# Patient Record
Sex: Female | Born: 1935
Health system: Southern US, Community
[De-identification: ages and names within clinical notes are randomized; demographics above are authoritative.]

## PROBLEM LIST (undated history)

## (undated) DIAGNOSIS — M8580 Other specified disorders of bone density and structure, unspecified site: Secondary | ICD-10-CM

## (undated) DIAGNOSIS — E039 Hypothyroidism, unspecified: Secondary | ICD-10-CM

## (undated) DIAGNOSIS — K5792 Diverticulitis of intestine, part unspecified, without perforation or abscess without bleeding: Secondary | ICD-10-CM

## (undated) DIAGNOSIS — K219 Gastro-esophageal reflux disease without esophagitis: Secondary | ICD-10-CM

## (undated) DIAGNOSIS — C3490 Malignant neoplasm of unspecified part of unspecified bronchus or lung: Secondary | ICD-10-CM

## (undated) DIAGNOSIS — G629 Polyneuropathy, unspecified: Secondary | ICD-10-CM

## (undated) DIAGNOSIS — C482 Malignant neoplasm of peritoneum, unspecified: Secondary | ICD-10-CM

## (undated) DIAGNOSIS — D72829 Elevated white blood cell count, unspecified: Secondary | ICD-10-CM

## (undated) HISTORY — DX: Polyneuropathy, unspecified: G62.9

## (undated) HISTORY — DX: Malignant neoplasm of peritoneum, unspecified: C48.2

## (undated) HISTORY — DX: Malignant neoplasm of unspecified part of unspecified bronchus or lung: C34.90

## (undated) HISTORY — DX: Elevated white blood cell count, unspecified: D72.829

## (undated) HISTORY — DX: Diverticulitis of intestine, part unspecified, without perforation or abscess without bleeding: K57.92

## (undated) HISTORY — DX: Gastro-esophageal reflux disease without esophagitis: K21.9

## (undated) HISTORY — DX: Hypothyroidism, unspecified: E03.9

## (undated) HISTORY — DX: Other specified disorders of bone density and structure, unspecified site: M85.80

## (undated) HISTORY — PX: TONSILLECTOMY: SHX5217

---

## 1997-11-24 ENCOUNTER — Ambulatory Visit (HOSPITAL_COMMUNITY): Admission: RE | Admit: 1997-11-24 | Discharge: 1997-11-24 | Payer: Self-pay | Admitting: Gastroenterology

## 1999-07-10 ENCOUNTER — Encounter (INDEPENDENT_AMBULATORY_CARE_PROVIDER_SITE_OTHER): Payer: Self-pay | Admitting: *Deleted

## 1999-07-10 ENCOUNTER — Ambulatory Visit (HOSPITAL_COMMUNITY): Admission: RE | Admit: 1999-07-10 | Discharge: 1999-07-10 | Payer: Self-pay | Admitting: Internal Medicine

## 2000-01-01 ENCOUNTER — Encounter: Payer: Self-pay | Admitting: Internal Medicine

## 2000-01-01 ENCOUNTER — Encounter: Admission: RE | Admit: 2000-01-01 | Discharge: 2000-01-01 | Payer: Self-pay | Admitting: Internal Medicine

## 2001-01-01 ENCOUNTER — Encounter: Admission: RE | Admit: 2001-01-01 | Discharge: 2001-01-01 | Payer: Self-pay | Admitting: Internal Medicine

## 2001-01-01 ENCOUNTER — Encounter: Payer: Self-pay | Admitting: Internal Medicine

## 2002-01-04 ENCOUNTER — Encounter: Payer: Self-pay | Admitting: Internal Medicine

## 2002-01-04 ENCOUNTER — Encounter: Admission: RE | Admit: 2002-01-04 | Discharge: 2002-01-04 | Payer: Self-pay | Admitting: Internal Medicine

## 2002-02-12 ENCOUNTER — Ambulatory Visit (HOSPITAL_COMMUNITY): Admission: RE | Admit: 2002-02-12 | Discharge: 2002-02-12 | Payer: Self-pay | Admitting: Gastroenterology

## 2002-02-12 ENCOUNTER — Encounter (INDEPENDENT_AMBULATORY_CARE_PROVIDER_SITE_OTHER): Payer: Self-pay | Admitting: *Deleted

## 2003-01-14 ENCOUNTER — Encounter: Payer: Self-pay | Admitting: Internal Medicine

## 2003-01-14 ENCOUNTER — Encounter: Admission: RE | Admit: 2003-01-14 | Discharge: 2003-01-14 | Payer: Self-pay | Admitting: Internal Medicine

## 2003-01-21 ENCOUNTER — Other Ambulatory Visit: Admission: RE | Admit: 2003-01-21 | Discharge: 2003-01-21 | Payer: Self-pay | Admitting: Internal Medicine

## 2004-01-16 ENCOUNTER — Encounter: Admission: RE | Admit: 2004-01-16 | Discharge: 2004-01-16 | Payer: Self-pay | Admitting: Internal Medicine

## 2004-08-05 DIAGNOSIS — C482 Malignant neoplasm of peritoneum, unspecified: Secondary | ICD-10-CM

## 2004-08-05 HISTORY — PX: SPLENECTOMY: SUR1306

## 2004-08-05 HISTORY — PX: ABDOMINAL HYSTERECTOMY: SUR658

## 2004-08-05 HISTORY — PX: OOPHORECTOMY: SHX86

## 2004-08-05 HISTORY — DX: Malignant neoplasm of peritoneum, unspecified: C48.2

## 2004-08-05 HISTORY — PX: TOTAL ABDOMINAL HYSTERECTOMY W/ BILATERAL SALPINGOOPHORECTOMY: SHX83

## 2005-01-15 ENCOUNTER — Encounter: Admission: RE | Admit: 2005-01-15 | Discharge: 2005-01-15 | Payer: Self-pay | Admitting: Internal Medicine

## 2005-01-15 IMAGING — CT CT PELVIS W/ CM
1 of 2 series · 14 of 32 positions shown, 18 images · IV contrast (GASTROGRAFIN & [ID] OMNI 300)
Comparison: none

CLINICAL DATA: Severe left lower quadrant pain.  
 CT ABDOMEN AND PELVIS WITH CONTRAST ? [DATE]:
 Scans were performed following intravenous injection of 100 cc of Omnipaque 300.  
 CT ABDOMEN WITH CONTRAST:
 The scan demonstrates that the patient has multiple masses in the spleen with the largest measuring 8 cm in diameter.  This mass is inhomogeneous with what I suspect is some central necrosis.  There is evidence of some subcapsular hemorrhage adjacent to this large lesion and there is what appears to be a daughter lesion just superomedial to the large lesion measuring 1 cm in size.  There is a 2.7 cm lesion in the superior medial aspect of the spleen, and there is a 2.3 cm cystic lesion in the splenic hilum. The differential diagnosis of these appearances should include metastatic disease, lymphoma, and, less likely, angiosarcoma.  The liver, pancreas, and adrenal glands appear normal.  The kidneys are normal other than the mass effect upon the left kidney by the large mass on the lower pole of the spleen.  Three small cystic lesions in the liver all appear to be benign cysts with no enhancement on delayed imaging.  One is in the inferior aspect of the right lobe measuring 15 mm in size, and the other two are more superior, one in the right lobe and one in the left, each measuring approximately 7 mm in size. 
 There is no periaortic adenopathy.  There are numerous diverticula in the colon, primarily in the descending portion but also in the cecum and ascending and transverse portions.

[Series 2: a&p w/ · axial · 0.64mm/px · z∈[-381,-11]mm · 14 of 81 slices shown, 18 images]
[im 4/81  soft-tissue]
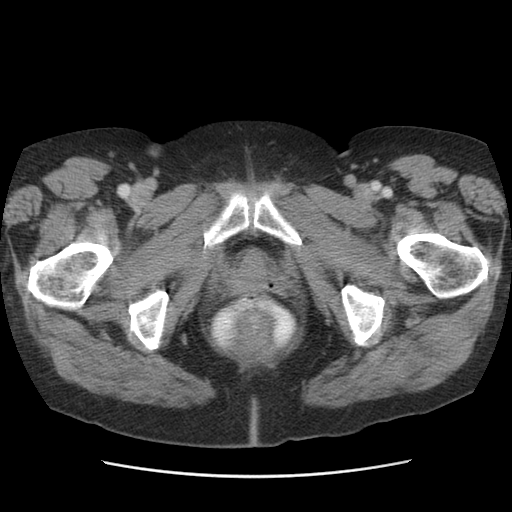
[im 4/81  bone]
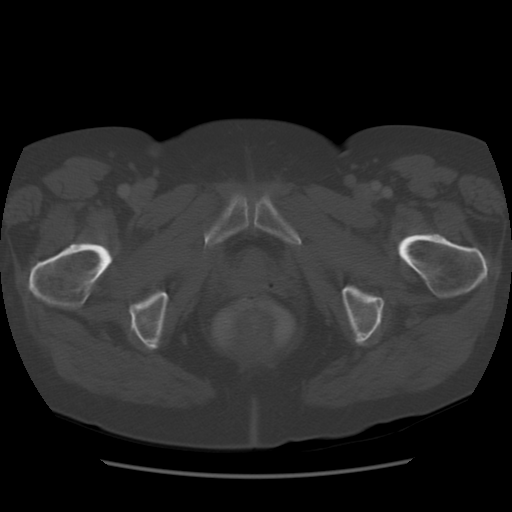
[im 11/81  soft-tissue]
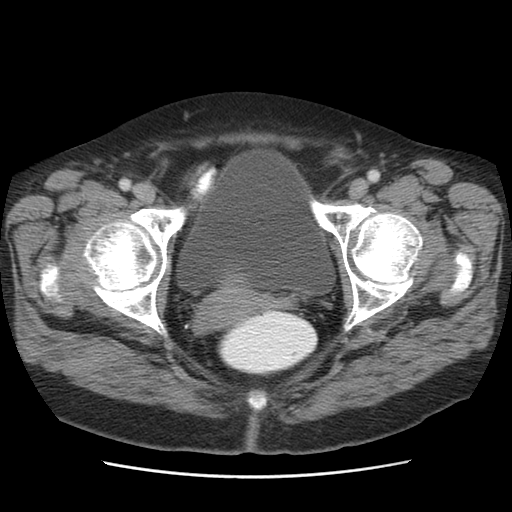
[im 19/81  soft-tissue]
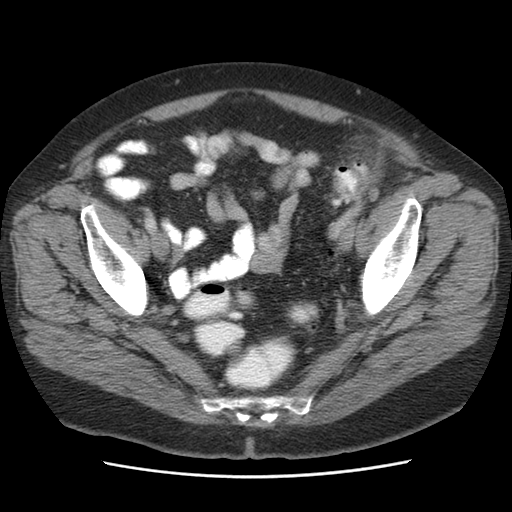
[im 26/81  soft-tissue]
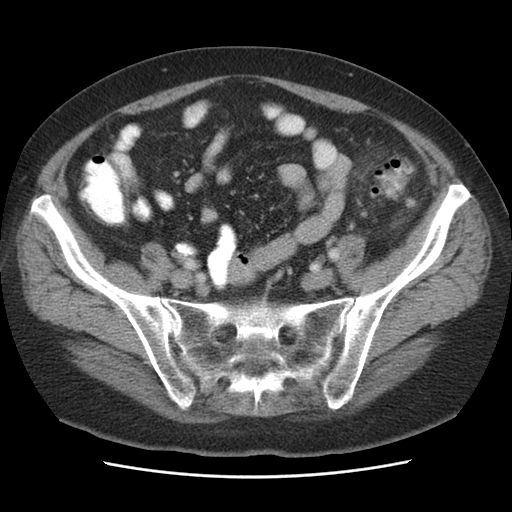
[im 30/81  soft-tissue]
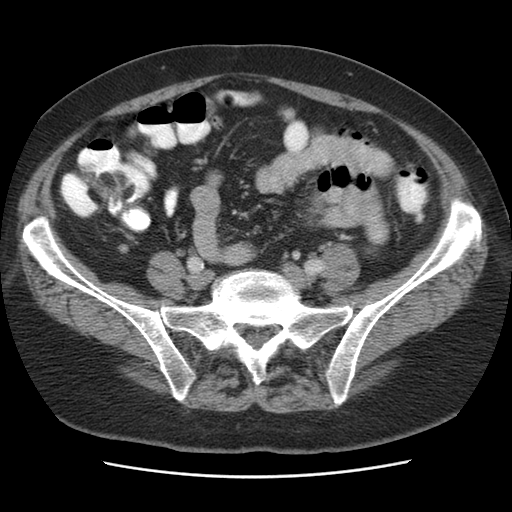
[im 37/81  soft-tissue]
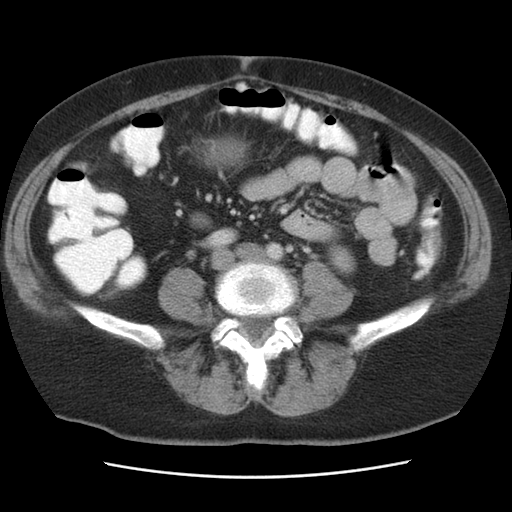
[im 44/81  soft-tissue]
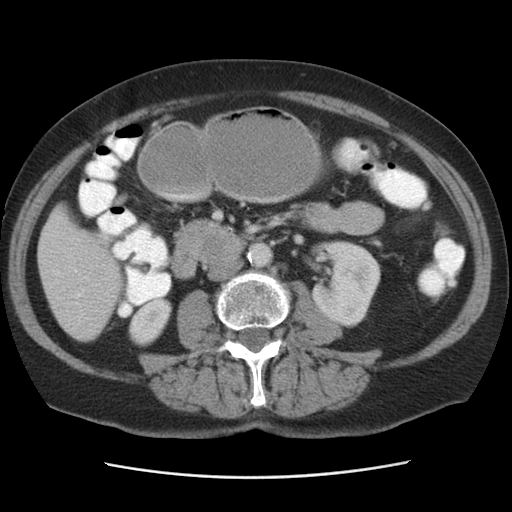
[im 51/81  soft-tissue]
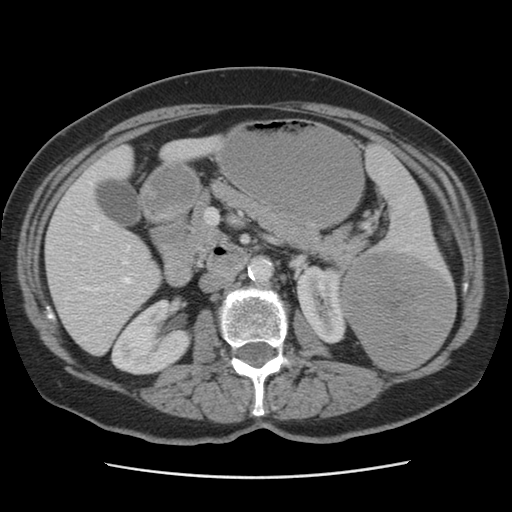
[im 55/81  soft-tissue]
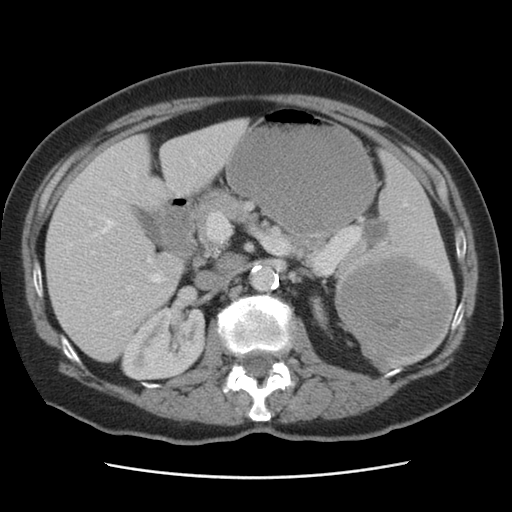
[im 55/81  bone]
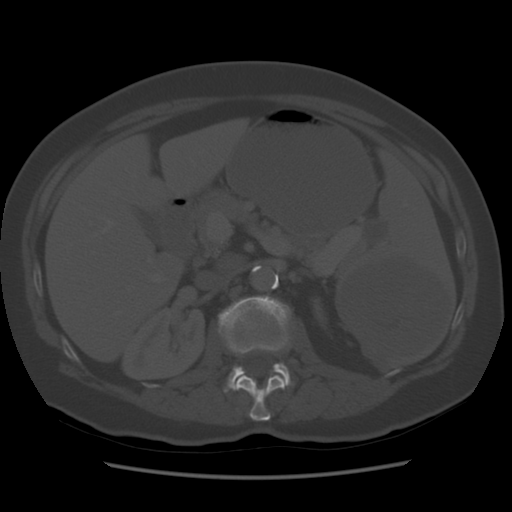
[im 62/81  soft-tissue]
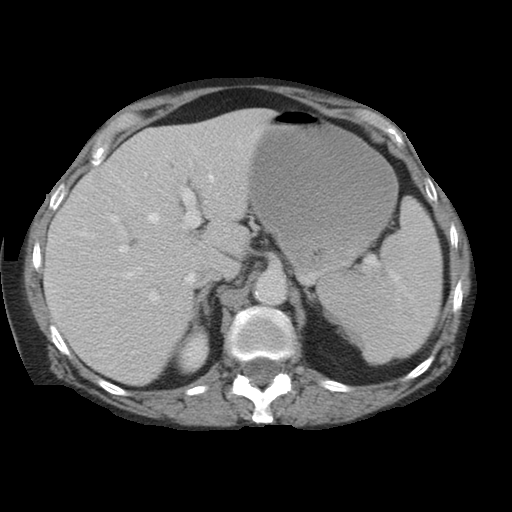
[im 66/81  lung]
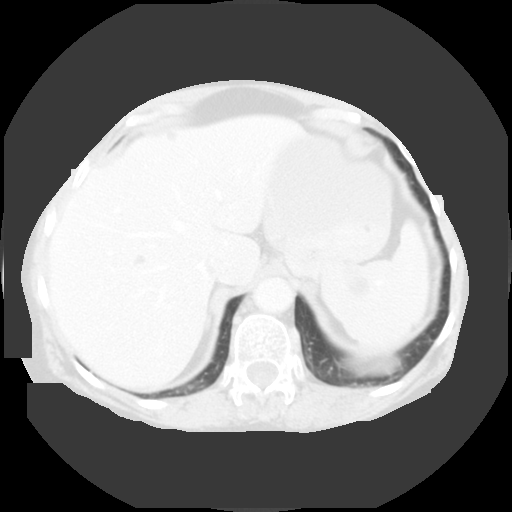
[im 70/81  soft-tissue]
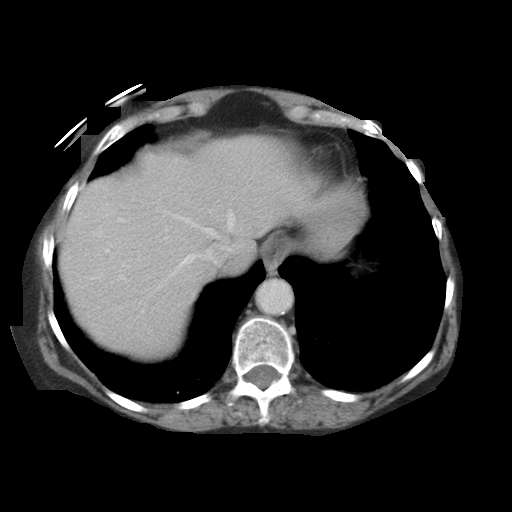
[im 70/81  lung]
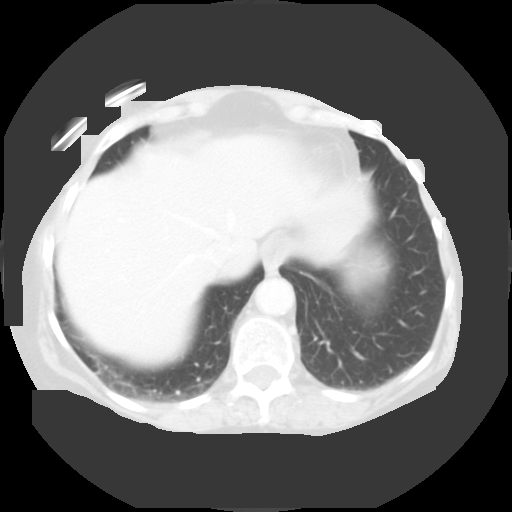
[im 73/81  lung]
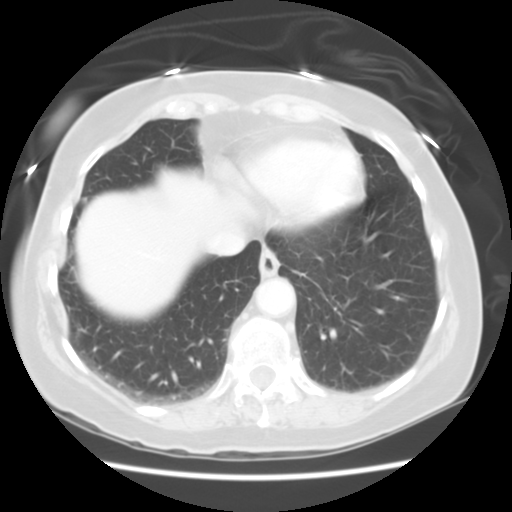
[im 77/81  soft-tissue]
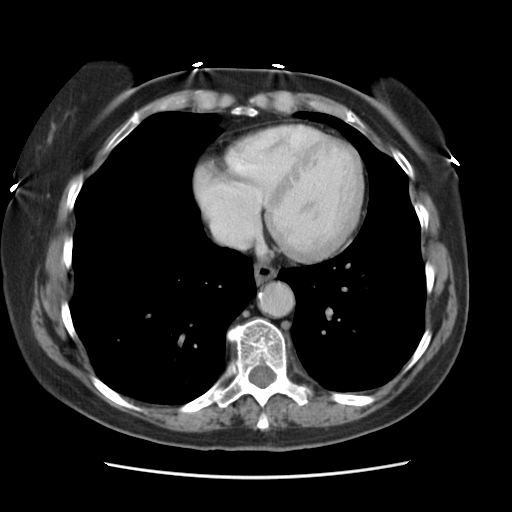
[im 77/81  lung]
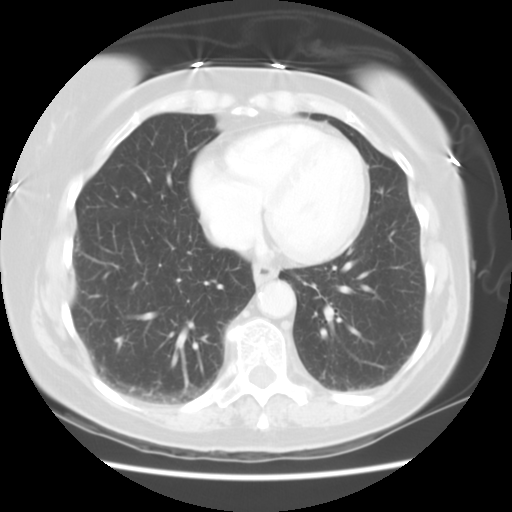

[14 of 32 positions shown; findings below may reference images not displayed]

IMPRESSION: 1.  Multiple splenic masses worrisome for malignancy of unknown etiology.
 2.  Benign liver cysts.
 3.  Diverticulosis. 
 CT PELVIS WITH CONTRAST:
 There is a focal area of acute diverticulitis over a 6 cm segment of the distal descending colon at the junction with the sigmoid in the left lower quadrant.  There is pericolonic soft tissues inflammation without evidence of abscess or free air.  The wall of the colon is focally thickened at that point although there is no definitive mass.  There are multiple diverticula in the distal descending colon. The uterus and ovaries are identified and appear normal.  The terminal ileum appears normal.  No free fluid or adenopathy of the pelvis.
IMPRESSION: Acute diverticulitis of the descending colon with pericolonic inflammation without an abscess. 
 This report was called to Dr. MANUEL QUINTAS by Dr. MANUEL QUINTAS.

## 2005-01-16 ENCOUNTER — Inpatient Hospital Stay (HOSPITAL_COMMUNITY): Admission: AD | Admit: 2005-01-16 | Discharge: 2005-01-17 | Payer: Self-pay | Admitting: Internal Medicine

## 2005-01-16 IMAGING — CR DG CHEST 2V
2 series · 2 of 2 positions shown · non-contrast
Comparison: None.

CLINICAL DATA: Diverticulitis, splenic mass. 
 CHEST ? 2 VIEW:

[w chest pa]
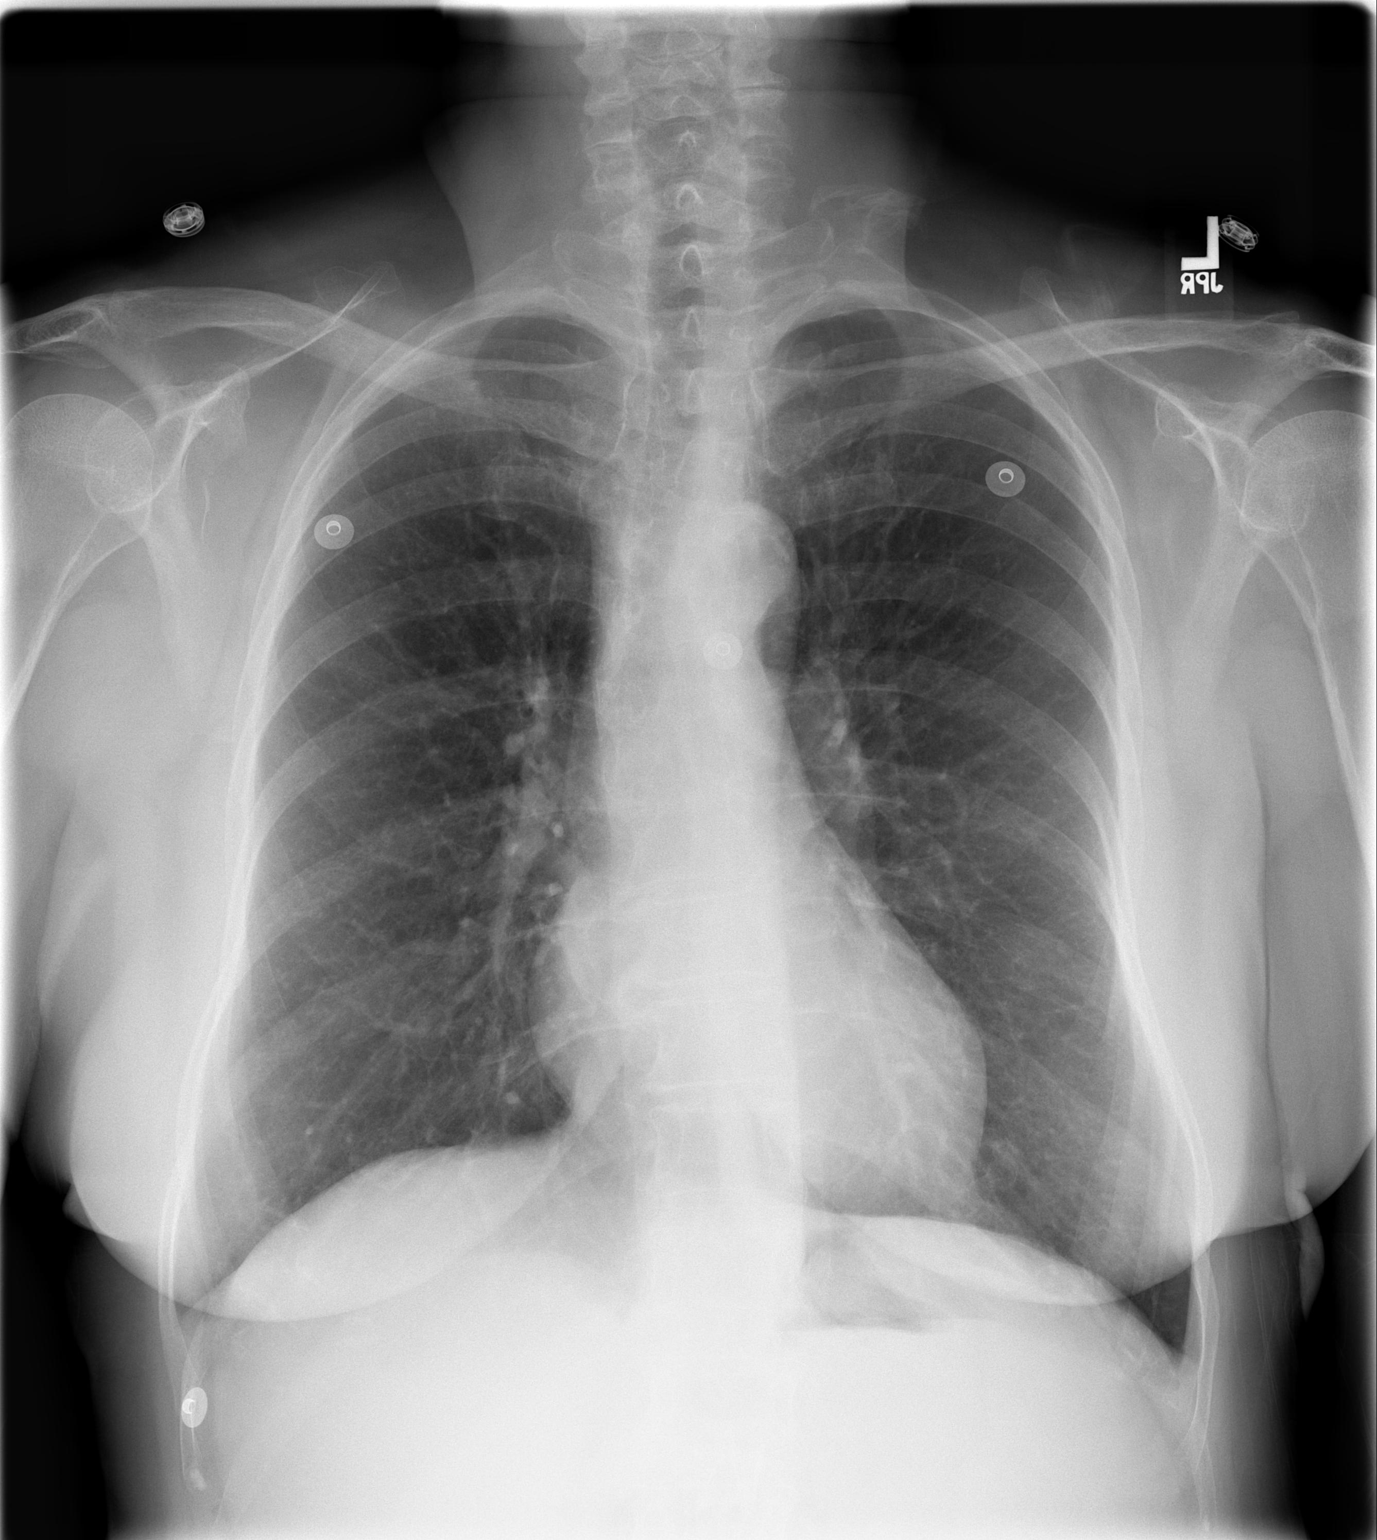

[w chest lat]
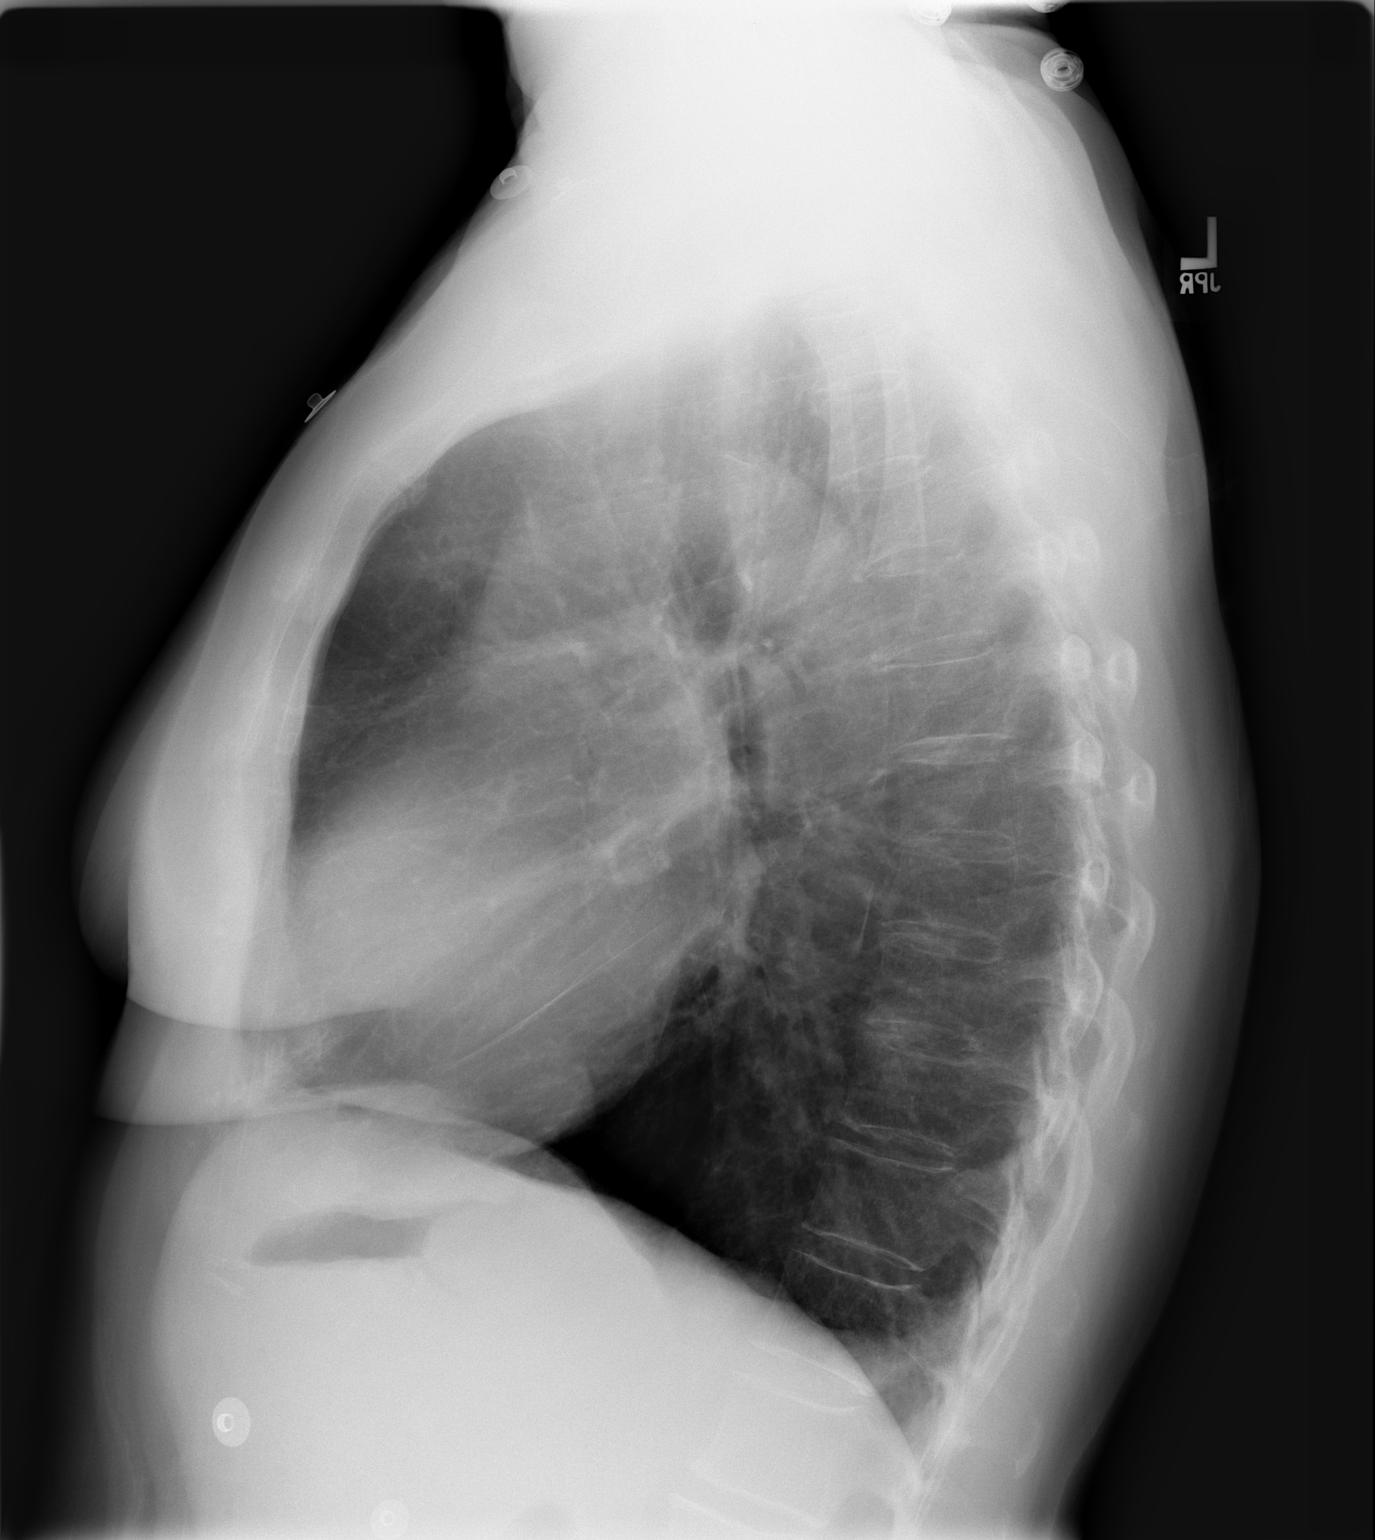

[2 of 2 positions shown; findings below may reference images not displayed]

The heart and mediastinal contours are within normal limits.  Mild COPD changes are present without focal airspace opacity or effusion.  Visualized skeleton is unremarkable.
IMPRESSION: Mild COPD.   No active disease.

## 2005-01-25 ENCOUNTER — Encounter (INDEPENDENT_AMBULATORY_CARE_PROVIDER_SITE_OTHER): Payer: Self-pay | Admitting: *Deleted

## 2005-01-25 ENCOUNTER — Ambulatory Visit (HOSPITAL_COMMUNITY): Admission: RE | Admit: 2005-01-25 | Discharge: 2005-01-25 | Payer: Self-pay | Admitting: *Deleted

## 2005-01-25 IMAGING — US US BIOPSY
1 series · 6 of 6 positions shown · non-contrast
Comparison: none

CLINICAL DATA: Patient was treated for diverticulitis 1-2 weeks ago and during the workup, incidental spleen lesions were identified. Spleen biopsy is requested.  This does carry increased risk of bleeding and consultation with the general surgeon. Dr. KPONYO, was obtained.  Together we decided to perform a fine needle aspirate biopsy first
 ULTRASOUND BIOPSY OF A SPLEEN LESION:
 In the right decubitus position, the left flank was prepped and draped in a sterile fashion. Lidocaine was utilized for local anesthesia.  Under ultrasound guidance a 19-gauge guide needle was inserted into the lower pole spleen lesion.  Three 22 gauge fine needle aspirates were obtained. A Gelfoam slurry was then injected through guide needle into the splenic lesion and filling the tract to the skin. No complications were encountered.
 Imaging demonstrates needle placement in the large lesion and the inferior aspect of the spleen.

[Series 1: unknown · 0.30mm/px · 6 of 6 slices shown]
[im 1/6]
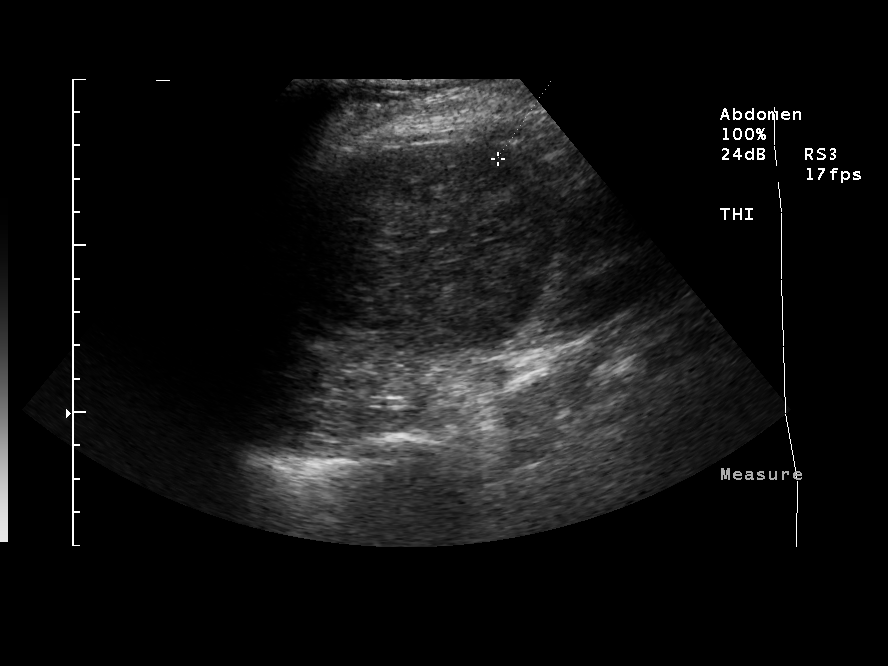
[im 2/6]
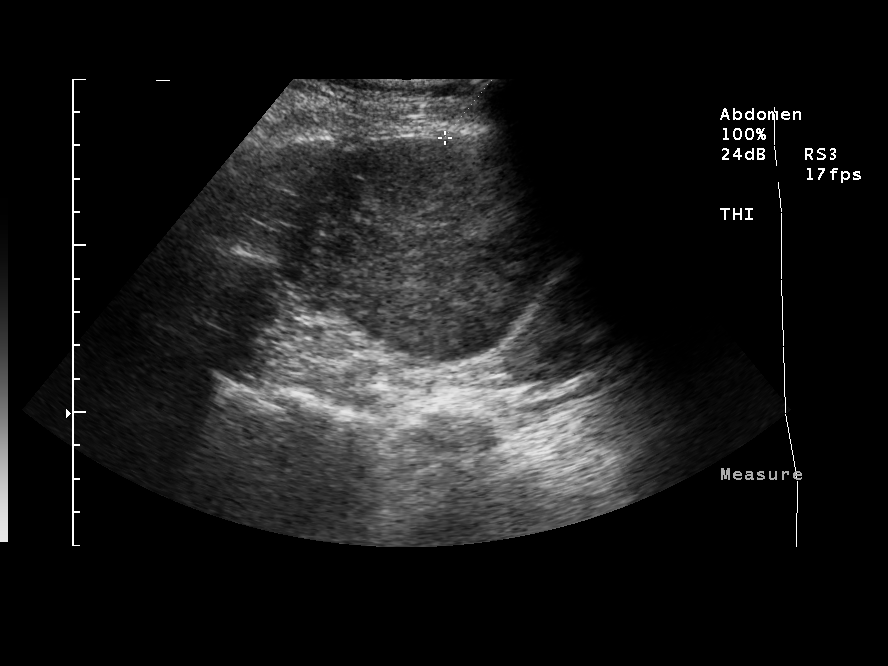
[im 3/6]
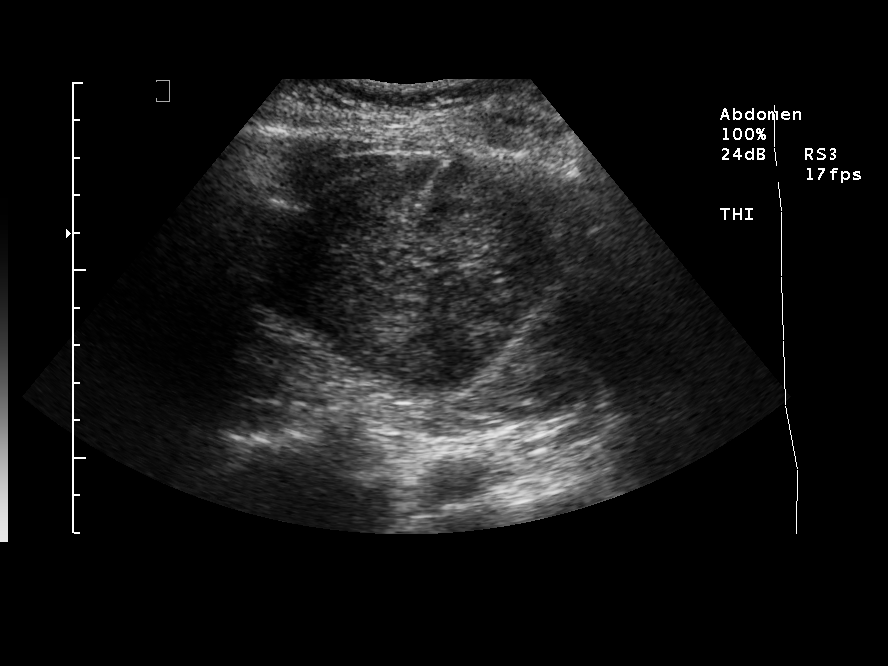
[im 4/6]
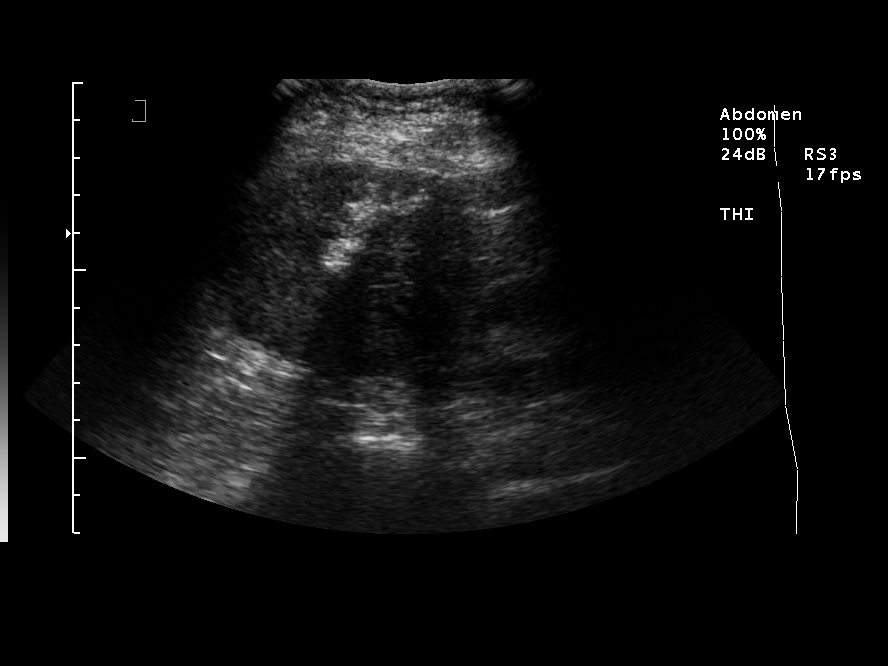
[im 5/6]
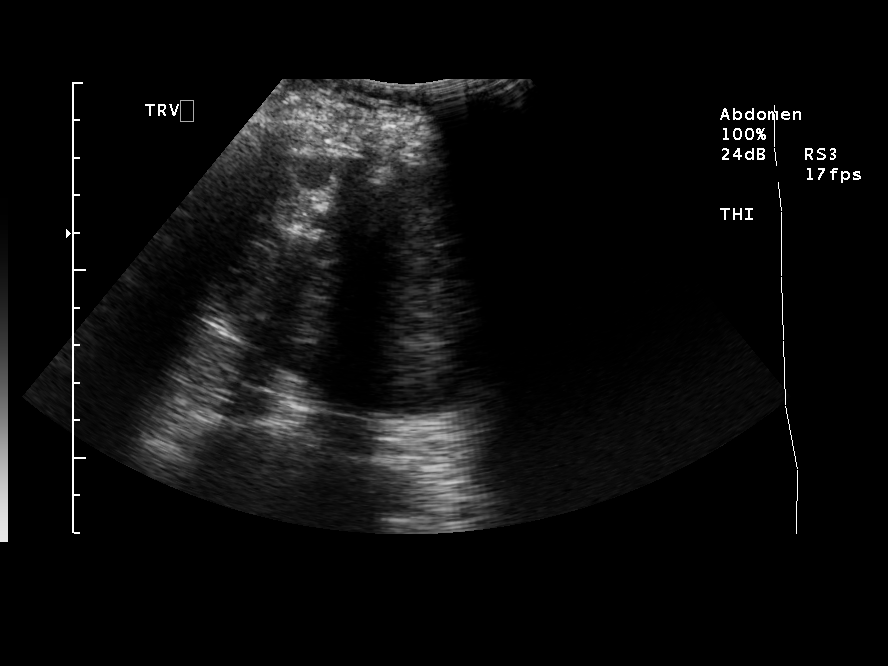
[im 6/6]
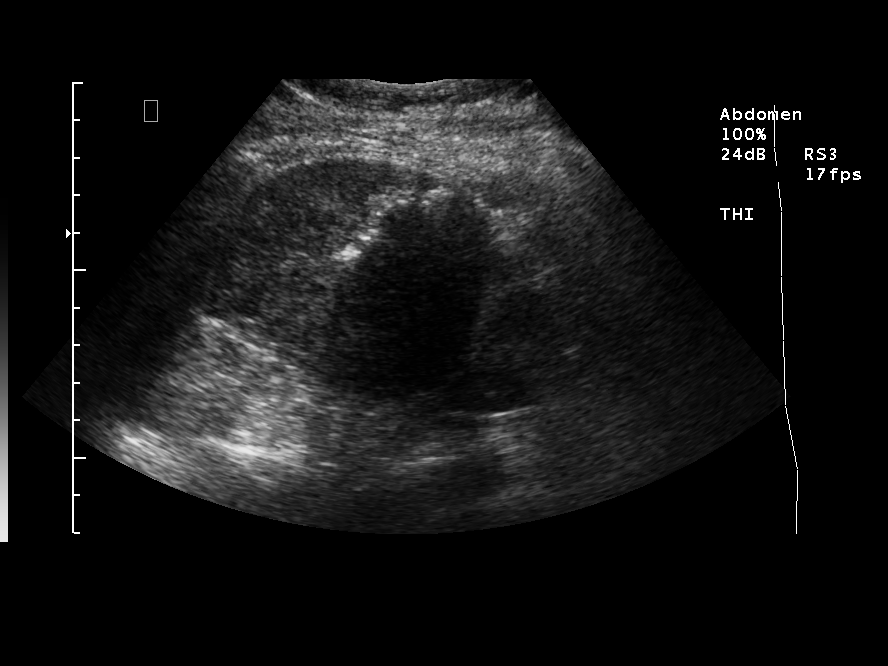

[6 of 6 positions shown; findings below may reference images not displayed]

IMPRESSION: Successful biopsy of a large splenic lesion. Pathology is pending.

## 2005-03-05 ENCOUNTER — Ambulatory Visit (HOSPITAL_COMMUNITY): Admission: RE | Admit: 2005-03-05 | Discharge: 2005-03-05 | Payer: Self-pay | Admitting: Gastroenterology

## 2005-03-06 ENCOUNTER — Encounter (INDEPENDENT_AMBULATORY_CARE_PROVIDER_SITE_OTHER): Payer: Self-pay | Admitting: *Deleted

## 2006-02-18 ENCOUNTER — Encounter: Admission: RE | Admit: 2006-02-18 | Discharge: 2006-02-18 | Payer: Self-pay | Admitting: Internal Medicine

## 2007-02-20 ENCOUNTER — Encounter: Admission: RE | Admit: 2007-02-20 | Discharge: 2007-02-20 | Payer: Self-pay | Admitting: Internal Medicine

## 2008-02-22 ENCOUNTER — Encounter: Admission: RE | Admit: 2008-02-22 | Discharge: 2008-02-22 | Payer: Self-pay | Admitting: Internal Medicine

## 2008-02-22 IMAGING — MG MM SCREEN MAMMOGRAM BILATERAL
4 series · 4 of 4 positions shown · non-contrast
Comparison: none

DG SCREEN MAMMOGRAM BILATERAL
Bilateral CC and MLO view(s) were taken.

DIGITAL SCREENING MAMMOGRAM WITH CAD:
The breast tissue is heterogeneously dense.  No masses or malignant type calcifications are 
identified.  Compared with prior studies.

[R CC]
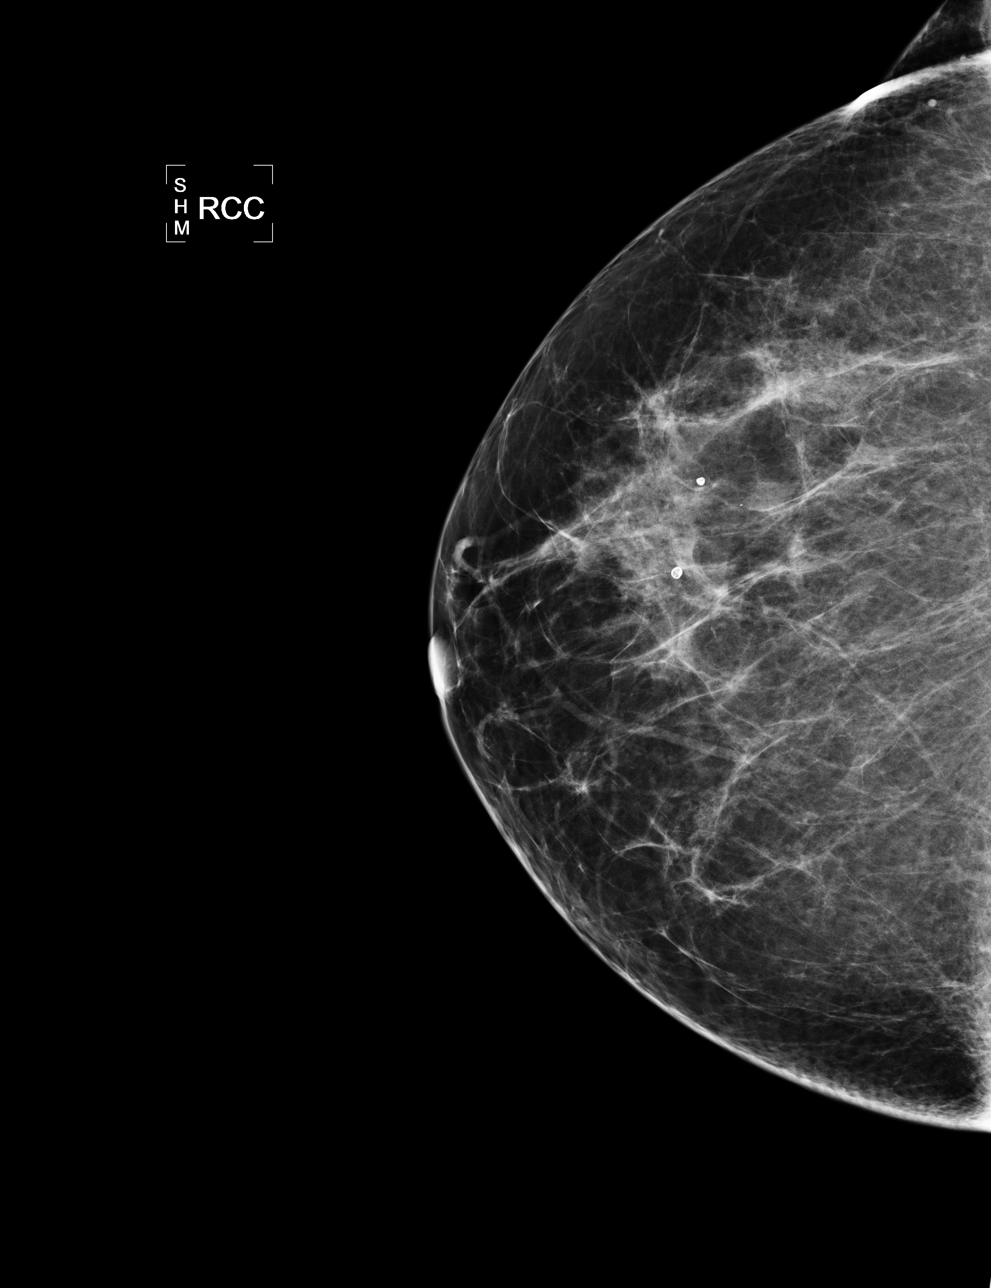

[L CC]
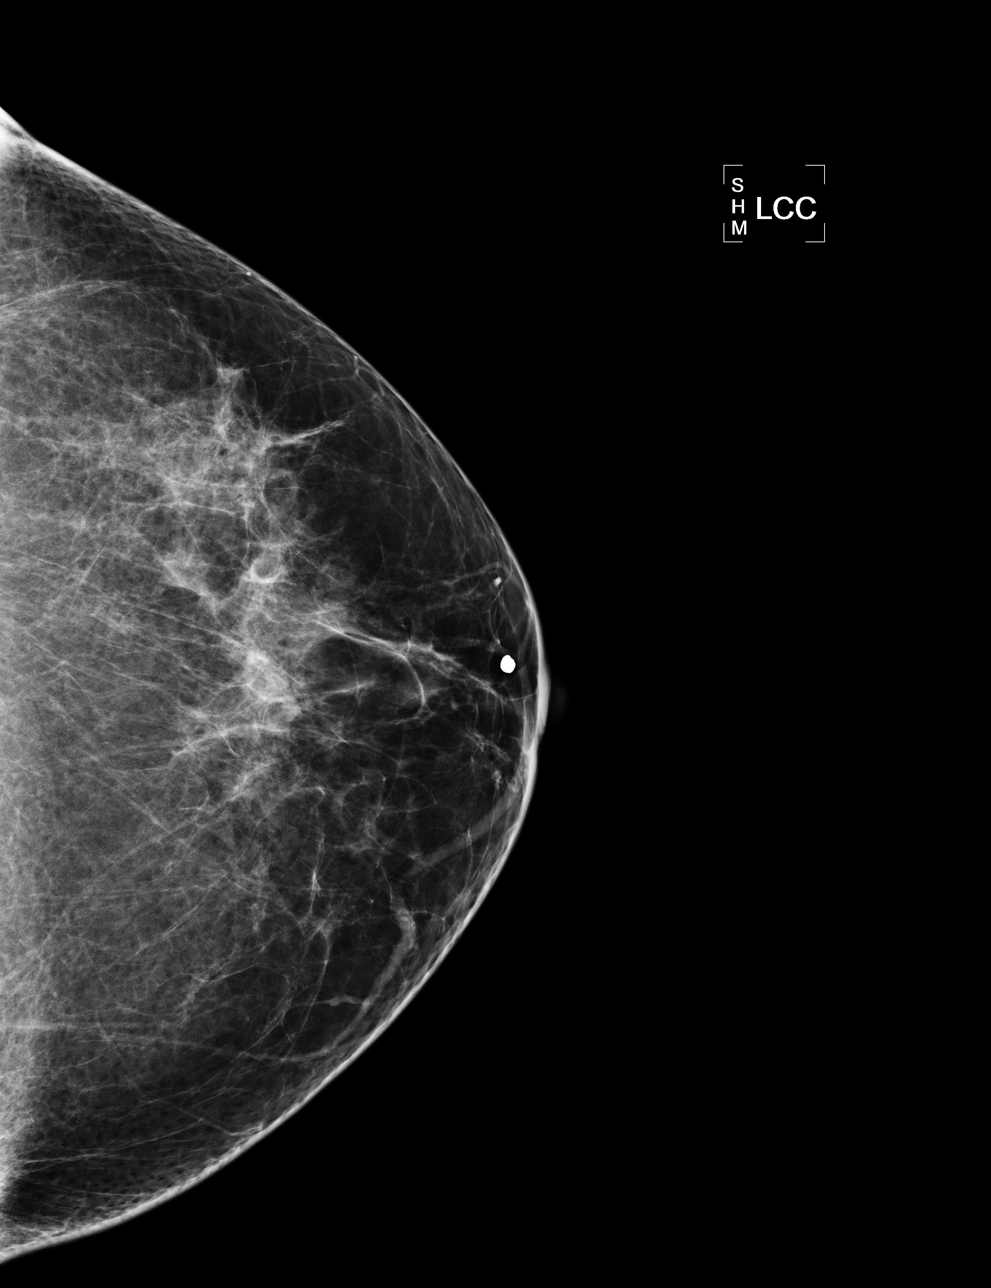

[L MLO]
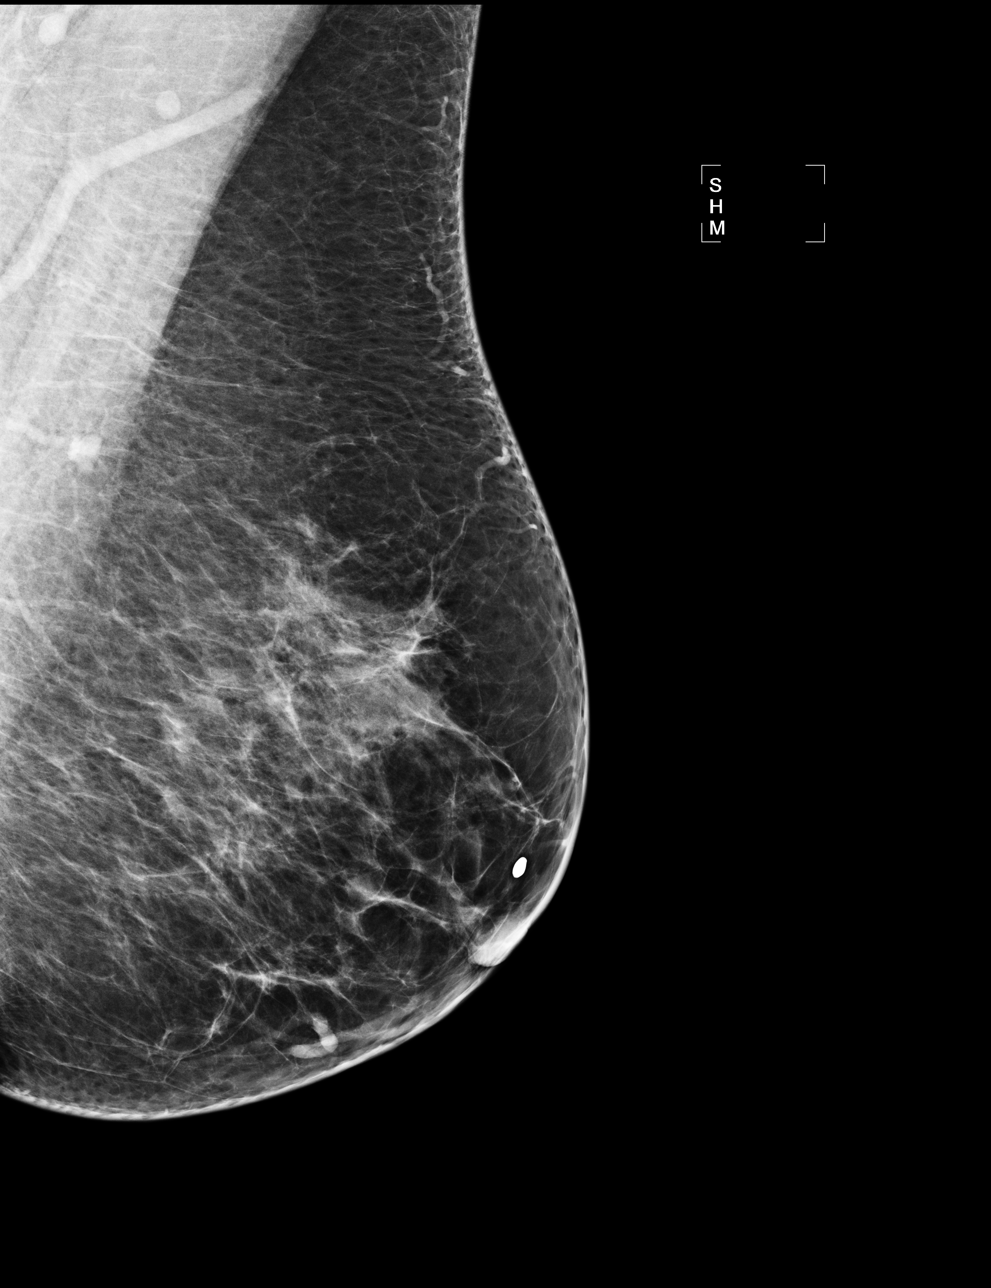

[R MLO]
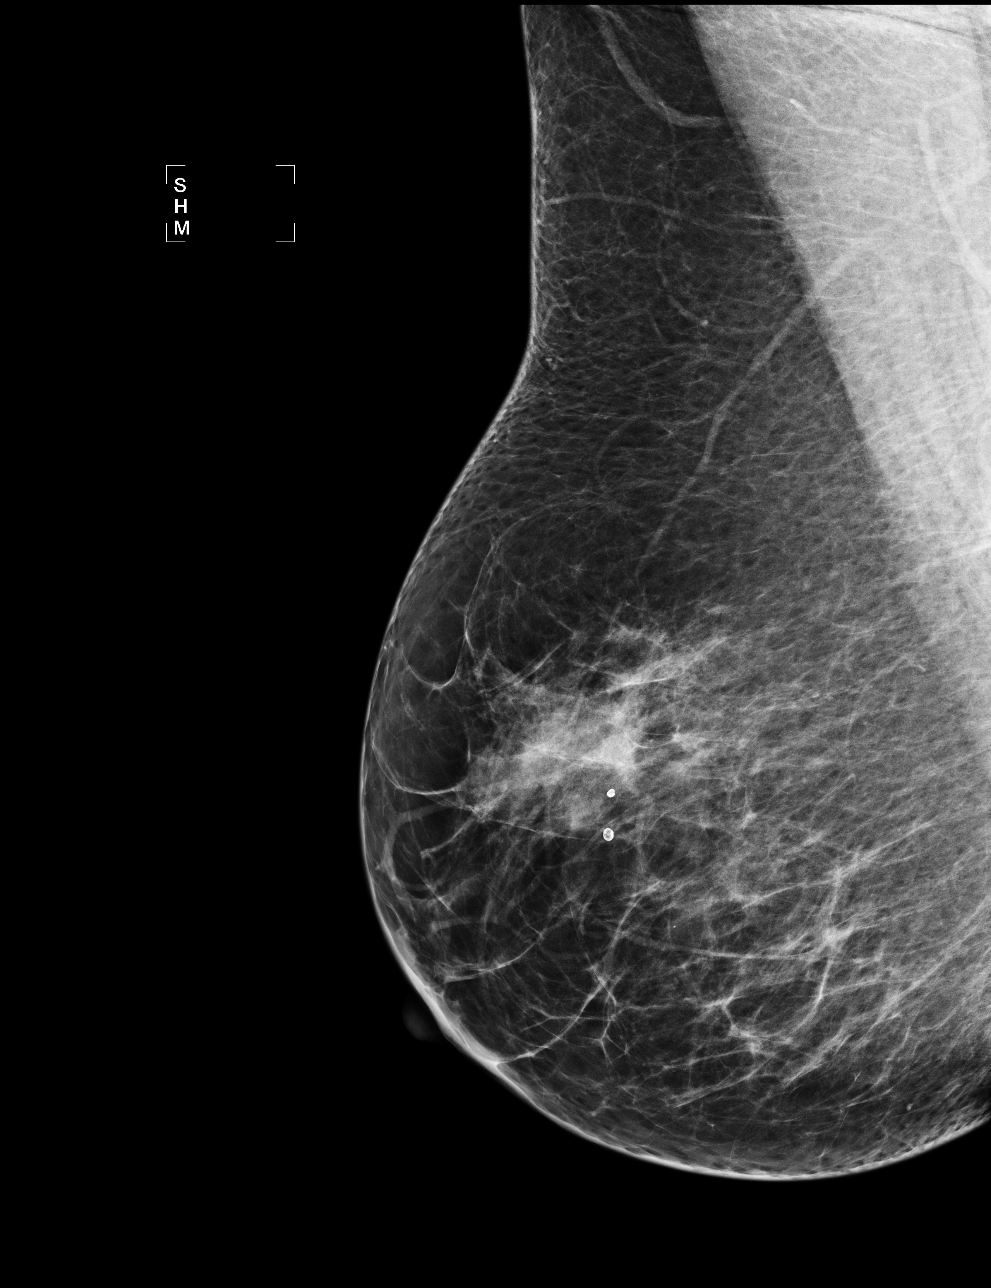

[4 of 4 positions shown; findings below may reference images not displayed]

IMPRESSION: No specific mammographic evidence of malignancy.  Next screening mammogram is recommended in one 
year.

ASSESSMENT: Negative - BI-RADS 1

Screening mammogram in 1 year.
ANALYZED BY COMPUTER AIDED DETECTION. , THIS PROCEDURE WAS A DIGITAL MAMMOGRAM.

## 2009-02-22 ENCOUNTER — Encounter: Admission: RE | Admit: 2009-02-22 | Discharge: 2009-02-22 | Payer: Self-pay | Admitting: Internal Medicine

## 2010-02-23 ENCOUNTER — Encounter: Admission: RE | Admit: 2010-02-23 | Discharge: 2010-02-23 | Payer: Self-pay | Admitting: Internal Medicine

## 2010-02-23 IMAGING — MG MM DIGITAL SCREENING
4 series · 4 of 4 positions shown · non-contrast
Comparison: Prior studies.

DG SCREEN MAMMOGRAM BILATERAL
Bilateral CC and MLO view(s) were taken.

DIGITAL SCREENING MAMMOGRAM WITH CAD:

[R CC]
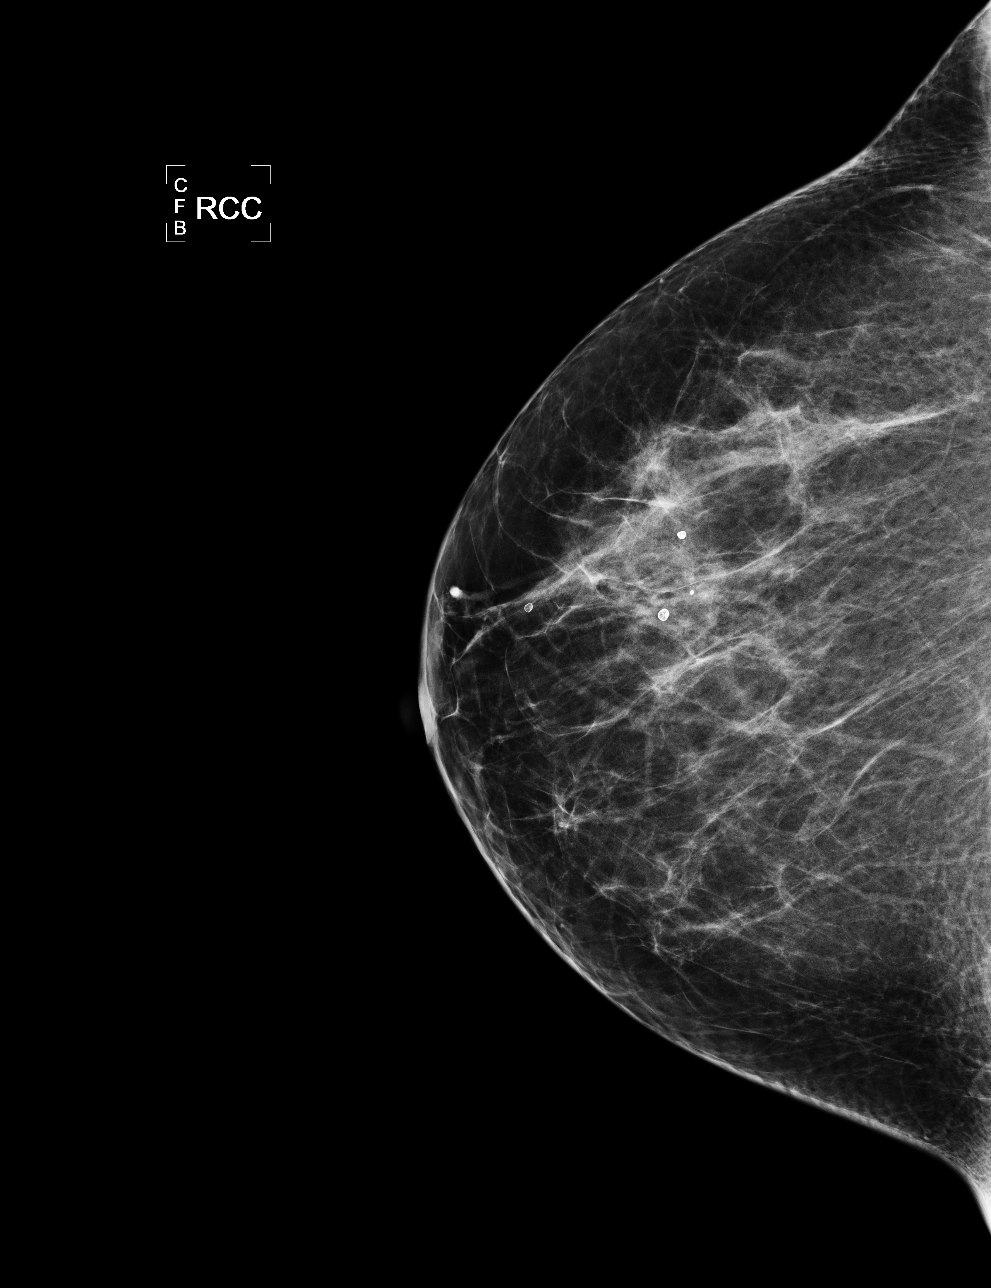

[L CC]
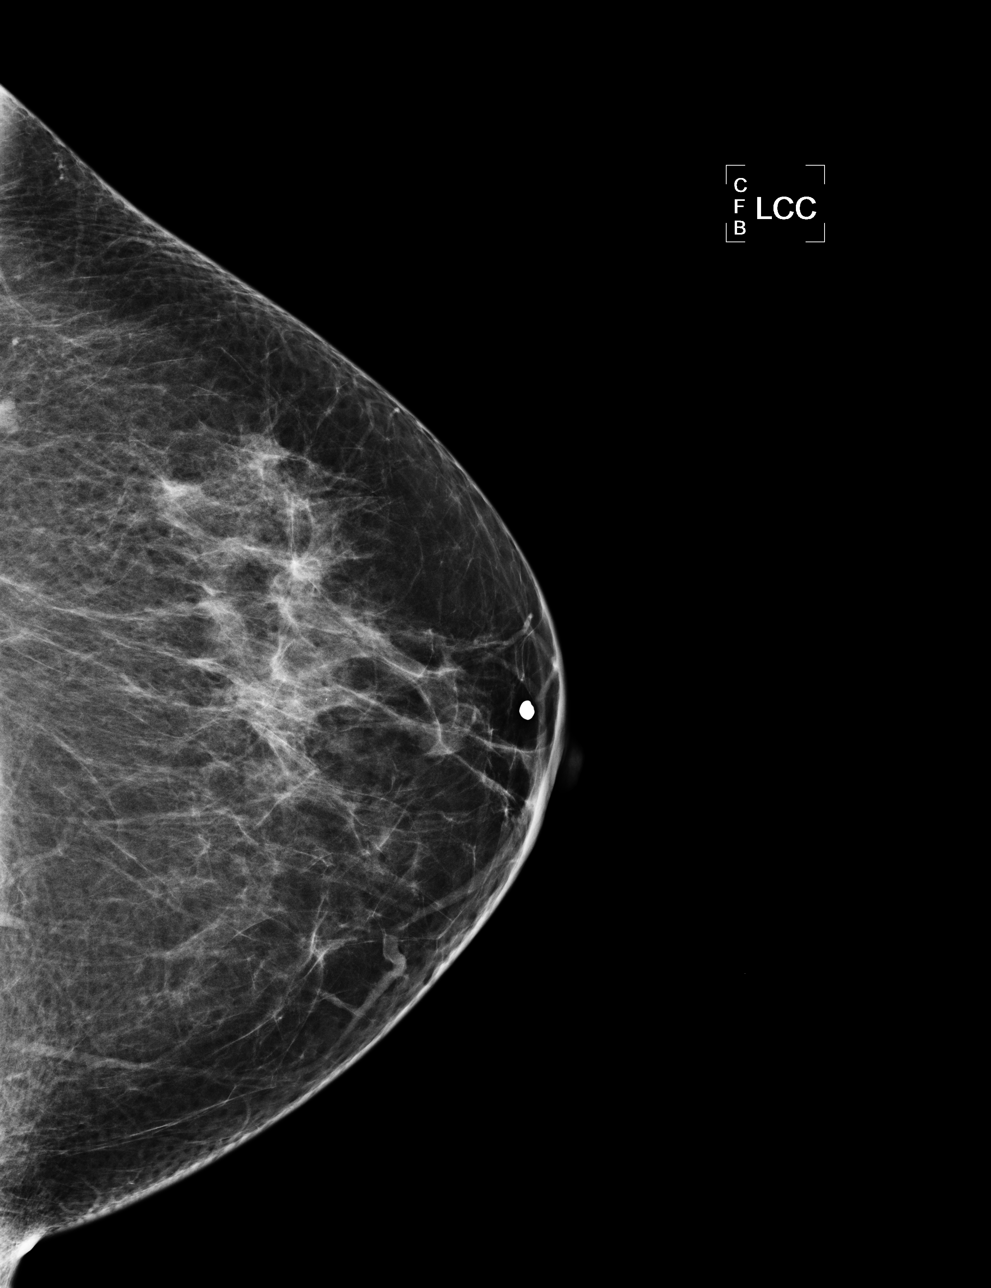

[L MLO]
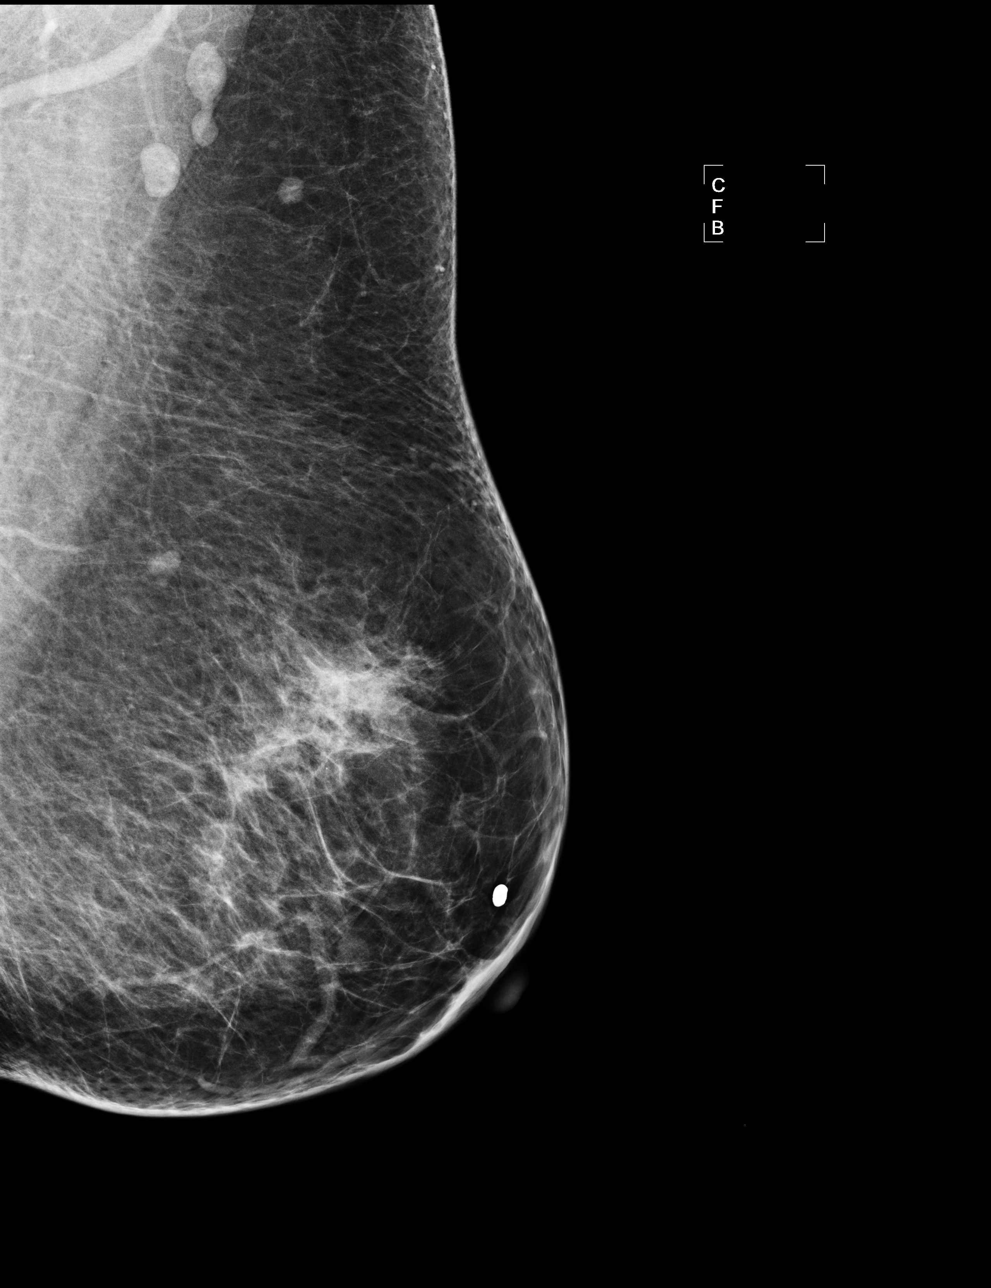

[R MLO]
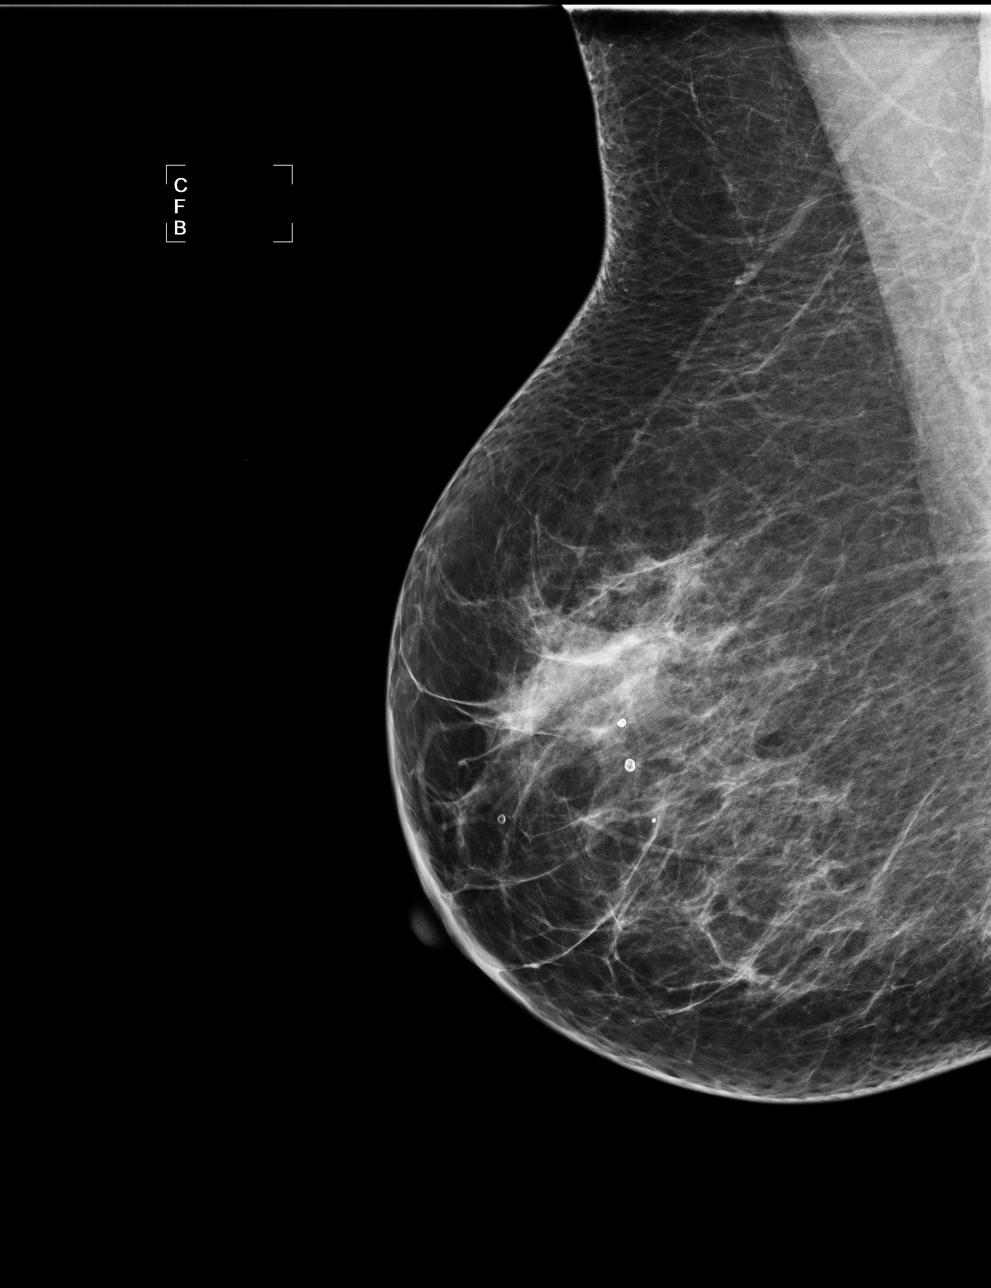

[4 of 4 positions shown; findings below may reference images not displayed]

There are scattered fibroglandular densities.  There is no dominant mass, architectural distortion 
or calcification to suggest malignancy.

Images were processed with CAD.
IMPRESSION: No mammographic evidence of malignancy.  Suggest yearly screening mammography.

A result letter of this screening mammogram will be mailed directly to the patient.

ASSESSMENT: Negative - BI-RADS 1

Screening mammogram in 1 year.
,

## 2010-08-05 HISTORY — PX: COLONOSCOPY: SHX174

## 2010-08-26 ENCOUNTER — Encounter: Payer: Self-pay | Admitting: Internal Medicine

## 2010-12-21 NOTE — Procedures (Signed)
[facility]  Patient:    Elaine [name], Elaine [name] Visit Number: [id number] MRN: [id number]          Service Type: END Location: ENDO Attending Physician:  Nelda Marseille Dictated by:   Petra Kuba, M.D. Proc. Date: 02/12/02 Admit Date:  02/12/2002 Discharge Date: 02/12/2002   CC:         Zollie Beckers D. Renne Crigler, M.D.   Procedure Report  PROCEDURE:  Colonoscopy with polypectomy.  INDICATIONS:  The patient with a history of colon polyps, due for repeat screening. Consent was signed after the risks, benefits, methods and options were thoroughly discussed in the past.  MEDICATIONS USED:  Demerol 50, Versed 5.  DESCRIPTION OF THE PROCEDURE:  A rectal was pertinent for external hemorrhoids. A digital examination was pertinent for being slightly tight anal and rectal sphincter which was slightly tender but no obvious mass lesions.  The pediatric video adjustable colonoscope was inserted and easily advanced around the colon to the cecum. This did not require any abdominal pressure or any position changes on insertion. Some left-sided and right-sided, left greater than right diverticuli were seen. The scope was inserted just into the opening of the terminal ileum. A quick look at the TI was normal.  The scope was then slowly withdrawn. The prep was adequate. There was some liquid stool that required washing and suctioning. There was some stool adherent to the wall of the right colon which could not be washed off. On slow withdrawal through the colon, left greater than right diverticuli were seen. A small polyp in the descending colon was seen and hot biopsied x 2. The scope was further withdrawn. A tiny distal sigmoid polyp was hot biopsied as well and put in the same container.  The scope was withdrawn back to the rectum and retroflexed. On retroflexed visualization a small polyp and some internal hemorrhoids were seen and hot biopsied x 1 on  retroflexion and x 1 on straight visualization. No additional findings were seen. This polyp was put in a second container.  The scope was reinserted a short way up the left side of the colon. Air was suctioned and the scope was removed. The patient tolerated the procedure well. There was no obvious immediate complication.  ENDOSCOPIC DIAGNOSES: 1. Internal and external hemorrhoids. 2. Distal rectal polyp hot biopsy. 3. Two small sigmoid and descending polyps, hot biopsied. 4. Left greater than right diverticuli. 5. Otherwise within normal limits to the cecum with a quick look at the terminal ileum.  PLAN:  Await pathology to determine future colonic screening. Have to see back p.r.n., otherwise return to the care of Dr. Renne Crigler for the customary health care maintenance to include yearly rectals and guaiacs. Dictated by:   Petra Kuba, M.D. Attending Physician:  Nelda Marseille DD:  02/12/02 TD:  02/15/02 Job: [id number]

## 2010-12-21 NOTE — H&P (Signed)
NAMEMARGRETTE, [name]             ACCOUNT NO.:  [id number]   MEDICAL RECORD NO.:  [id number]          PATIENT TYPE:  INP   LOCATION:  3735                         FACILITY:  [facility]   PHYSICIAN:  Elliot Cousin, M.D.    DATE OF BIRTH:  24-May-1932   DATE OF ADMISSION:  01/16/2005  DATE OF DISCHARGE:                                HISTORY & PHYSICAL   PRIMARY CARE PHYSICIAN:  Soyla Murphy. Pharr, M.D.   CHIEF COMPLAINT:  Left lower quadrant abdominal pain, left lower quadrant  lump, diarrhea.   HISTORY OF PRESENT ILLNESS:  The patient is a 77 year old lady with a past  medical history significant for diverticulosis, colon polyps,  hypothyroidism, who presents to the hospital as a direct admission from Dr.  Carolee Rota office.  The patient states that she began having abdominal pain,  nausea, and vomiting during the middle part of May.  The patient felt that  her symptoms were secondary to eating bad seafood in [place], [place].  Over the course of the following 2-3 weeks, the nausea and  vomiting resolved.  The left lower quadrant abdominal pain eased.  However,  approximately 4-5 days ago, the patient began feeling bad again.  She began  to have more left lower quadrant pain accompanied by 4-5 loose diarrheal  stools.  The patient also felt a lump in the lateral left lower quadrant.  The patient has not had any recent vomiting, although she has had some  nausea.  She denies bloody diarrhea and black tarry stools.  She denies  hematemesis and hemoptysis.  She denies any recent subjective fever and  chills.  The patient presented to Dr. Carolee Rota office, on January 14, 2005,  secondary to progressive left lower quadrant pain and generalized malaise.  Ms. Elaine [name], Dr. Carolee Rota P.A., evaluated the patient in the office.  Per  Ms. Barefoot, the patient had diverticulitis clinically.  Ms. Elaine [name] also  palpated a tender palpable mass in the left lower quadrant.  The patient was  treated with  Cipro and Flagyl and referred to [facility] for further  evaluation with a CT scan of the abdomen and pelvis.  Dr. Renne Crigler became aware  of the results of the CT scan today.  The CT scan of the abdomen and pelvis,  performed on January 15, 2005, revealed multiple splenic masses worrisome for  malignancy of unknown etiology and evidence of some subcapsular hemorrhage.  The CT scan also revealed acute diverticulitis of the descending colon with  pericolonic inflammation without any abscess.  The patient was told of the  findings.  Dr. Renne Crigler recommended hospital admission for further evaluation  and management.   PAST MEDICAL HISTORY:  1.  Diverticulosis with a history of diverticulitis in the past.  2.  Hypothyroidism.  3.  EGD per Dr. Ewing Schlein, in July 2003, revealed internal and external      hemorrhoids, sigmoid, and descending polyps, and right diverticula.  The      pathology report revealed an adenomatous polyp and a rectal polyp which      revealed hyperplastic squamous mucosa.  4.  Fibromyalgia  Rheumatica  5.  Status post tonsillectomy in the past.   MEDICATIONS:  1.  Levothyroxine 50 mcg daily.  2.  Cipro 500 mg b.i.d.  3.  Flagyl 500 mg q.i.d.  4.  Calcium 600 mg b.i.d.  5.  Multivitamin with iron once daily.   ALLERGIES:  1.  BELLADONNA.  2.  SULFA.  3.  ALEVE.   SOCIAL HISTORY:  The patient is divorced.  She has three children.  She is a  retired [employer] account planner.  She is fairly active.  She generally walks three  miles per day.  She denies alcohol, tobacco, and illicit drug use.   FAMILY HISTORY:  The patient's mother died of a heart attack at age 2.  Her  father died of colon cancer at age 24.  She had one sister who had Hilda Blades disease.  She had one brother who died of ITP.  She has two daughters  and one son.  One daughter is diabetic and the other daughter has Crohn's  disease.  Her son is alive and well.   REVIEW OF SYSTEMS:  The patient's review of  systems is positive for  occasional arthritic pain in her hands.  Otherwise her review of systems is  negative with the exception of what is mentioned above in the history of  present illness.   PHYSICAL EXAMINATION:  VITAL SIGNS:  Temperature is 98.6, pulse 80,  respiratory rate 18, blood pressure 112/79, oxygen saturation 99% on room  air.  GENERAL:  The patient is a pleasant, youthful-appearing, 77 year old  Caucasian lady who is currently sitting up in bed in no acute distress.  HEENT:  Head is normocephalic, nontraumatic.  Pupils are equal, round, and  reactive to light.  Extraocular movements are intact.  Conjunctivae are  clear.  Sclerae are white.  Tympanic membranes are clear bilaterally.  Nasal  mucosa is mildly dry.  No sinus tenderness.  Oropharynx reveals moist mucous  membranes.  No posterior exudates or erythema.  The patient has a full set  of dentures.  NECK:  Supple.  No adenopathy.  No thyromegaly.  No bruits.  No JVD.  LUNGS:  Clear to auscultation bilaterally.  HEART:  S1 S2 with no murmurs, rubs, or gallops.  ABDOMEN:  Positive bowel sounds.  Soft, nondistended.  There is a palpable  mass in the left upper quadrant.  No palpable mass in the left lower  quadrant.  She is mildly  tender over the left lower quadrant.  No rebound.  No guarding.  There is no flank ecchymosis.  RECTAL:  The patient has excellent rectal tone.  She does have a few  external hemorrhoids which were not bleeding.  There was a scant amount of  brown stool in the rectal vault which was guaiac negative.  No rectal mass  was palpated.  GU:  Deferred.  EXTREMITIES:  The patient's pedal pulses are 2+ bleeding.  No pretibial  edema.  No pedal edema.  The patient has excellent range of motion of her  extremities.  No acute joint abnormalities.  SKIN:  Good turgor.  NEUROLOGIC:  The patient is alert and oriented x 3.  Cranial nerves II-XII intact.  Strength is 5/5 throughout.  Sensation is intact.   Gait is within  normal limits.  LYMPH NODES:  No other palpable lymph nodes noted.   ADMISSION LABORATORY:  Chest x-ray:  Pending.  EKG:  Pending.  Sed rate 50.  PTT 34, PT 13.1, INR 1.1.  Lipase 27, amylase 70.  Sodium 141, potassium  3.9, chloride 107, CO2 27, glucose 124, BUN 11, creatinine 1.3.  Total  bilirubin 0.3, alkaline phosphatase 64, AST 17, ALT 13, total protein 6.6,  albumin 3.6, calcium 9.0.  WBC 8.7, hemoglobin 11.0, hematocrit 32.8, MCV  86.7, platelets 349.   CT scan of the abdomen and pelvis with contrast, January 15, 2005, multiple  splenic masses worrisome for malignancy of unknown etiology, the largest  measuring 8-cm in diameter.  The mass is inhomogeneous with suspected  central necrosis.  There is evidence of some subcapsular hemorrhage adjacent  to this large lesion.  There is another lesion associated with the 8-cm  lesion which measures 1-cm in size.  There is a 2.7-cm lesion in the  superior medial aspect of the spleen, and there is a 2.3-cm cystic lesion in  the splenic hilum.  The liver, pancreas, and adrenal glands appear normal.  The kidneys are normal other than the mass effect upon the left kidney by  the large mass on the lower pole of the spleen.  Three small cystic lesions  in the liver all appear to be benign cysts with no enhancement on delayed  imaging.  There is a focal area of acute diverticulitis over a 6-cm segment  of the distal descending colon at the junction with the sigmoid and the left  lower quadrant.  There is pericolonic soft tissue inflammation without  evidence of abscess or free air.  The wall of the colon is focally thickened  at that point, although there is no definitive mass.   ASSESSMENT:  1.  Multiple splenic masses with subcapsular hemorrhage associated with the      largest lesion.  Certainly this finding is worrisome for lymphoma.  2.  Acute diverticulitis of the descending colon with pericolonic      inflammation.  The  patient has been tolerating a soft diet at home with      no increase in abdominal pain.  She has not had any recent complaints of      diarrhea, nausea, or vomiting over the past 48 hours.  3.  Mild normocytic anemia.  4.  Hypothyroidism.   PLAN:  1.  The patient was directly admitted for further evaluation and management.  2.  We will consult general surgeon, Dr. Currie Paris.  I briefly      discussed the patient's case with Dr. Jamey Ripa.  He will see the patient      in the morning.  At this time, he recommended continued treatment of      diverticulitis.  There is uncertainty regarding whether or not the      patient will need a surgical excision of the spleen without a definitive      diagnosis.  3.  We will need to discuss the patient's findings with interventional      radiology to see if the splenic mass is amenable to biopsy even if it is      fine needle biopsy.  There is certainly a concern regarding further     hemorrhage, given this very vascular organ.  4.  We will continue antibiotic therapy with Cipro and Flagyl.  5.  We will start a low residue diet and continue to monitor the patient for      worsening abdominal pain.  6.  We will start a multivitamin with iron.  7.  We will consider a curbside consult with oncology if a biopsy of  the      spleen percutaneously is not recommended.  8.  We will check a chest x-ray, PA and lateral.  9.  Further recommendations per general surgery and/or interventional      radiology regarding the diagnostic workup.       DF/MEDQ  D:  01/16/2005  T:  01/16/2005  Job:  [id number]   cc:   Soyla Murphy. Renne Crigler, M.D.  [address]  [place]  [place] [address]  Fax: [phone]   Currie Paris, M.D.  [address]  [place]  [place] [address]

## 2010-12-21 NOTE — Op Note (Signed)
NAMEKEYLI, [name]             ACCOUNT NO.:  [id number]   MEDICAL RECORD NO.:  [id number]          PATIENT TYPE:  AMB   LOCATION:  ENDO                         FACILITY:  [facility]   PHYSICIAN:  Petra Kuba, M.D.    DATE OF BIRTH:  May 28, 1932   DATE OF PROCEDURE:  03/05/2005  DATE OF DISCHARGE:                                 OPERATIVE REPORT   PROCEDURE:  Colonoscopy.   INDICATIONS:  Patient with an abnormal CAT scan worrisome for metastatic  disease want to rule out colon source, also with a recent diverticulitis.  Consent was signed after risks, benefits, methods and options were  thoroughly discussed multiple times in the past.   MEDICINES USED:  Demerol 70, Versed 7.   DESCRIPTION OF PROCEDURE:  Rectal inspection was pertinent for small  external hemorrhoids with previous rectal stricture essentially unchanged.  The pediatric video adjustable colonoscope was inserted and easily advanced  around the colon to the cecum. This did require some abdominal pressure but  no position changes. On insertion, some left sided greater than rare right-  sided diverticula were seen but no mass lesions or significant inflammation  or infection. The cecum was identified by the appendiceal orifice and the  ileocecal valve. In fact, the scope was inserted a short ways into the  terminal ileum which was normal. Photo documentation was obtained. The scope  slowly withdrawn. Prep was adequate. There was some liquid stool that  required washing and suctioning and on slow withdrawal through the colon  other than the moderate significant left greater than rare right  diverticula, no abnormalities were seen other than a tiny distal sigmoid  polyp which was cold biopsied x2. No other mass lesions, polyps or  abnormalities were seen. Once back in the rectum, anorectal pull-through and  retroflexion confirmed the small hemorrhoids. The scope was straightened,  readvanced a short ways up the left side of  the colon, air was suctioned,  scope removed. The patient tolerated the procedure well. There was no  obvious or immediate complication.   ENDOSCOPIC DIAGNOSIS:  1.  Internal and external hemorrhoids with some scarring.  2.  Moderate left greater than rare right diverticula.  3.  Tiny distal sigmoid probable hyperplastic appearing polyp cold biopsied.  4.  Otherwise within normal limits to the terminal ileum.   PLAN:  Happy to see back p.r.n. Will need leave screening to Dr. Renne Crigler  pending surgical findings and healthcare condition in the future. Happy to  assist p.r.n. otherwise return care to the [facility] doctors and Dr. Renne Crigler  and be on standby to help as above.       MEM/MEDQ  D:  03/05/2005  T:  03/06/2005  Job:  [id number]   cc:   Soyla Murphy. Renne Crigler, M.D.  [address]  [place]  [place] [address]  Fax: [phone]   Dr. Lenis Noon  Surgery, [facility]ase   Dr. Noland Fordyce, Gynecology

## 2010-12-21 NOTE — Discharge Summary (Signed)
Elaine [name], Elaine [name]             ACCOUNT NO.:  [id number]   MEDICAL RECORD NO.:  [id number]          PATIENT TYPE:  INP   LOCATION:  3735                         FACILITY:  [facility]   PHYSICIAN:  Hettie Holstein, D.O.    DATE OF BIRTH:  06-May-1932   DATE OF ADMISSION:  01/16/2005  DATE OF DISCHARGE:  01/17/2005                                 DISCHARGE SUMMARY   PRIMARY CARE PHYSICIAN:  Soyla Murphy. Renne Crigler, M.D.   CONSULTATIONS:  Currie Paris, M.D., general surgery; Art A. Hoss,  M.D., interventional radiology.   ADMISSION DIAGNOSES:  1.  Diverticulitis.  2.  Splenic masses per imaging studies performed in the outpatient setting      via CT scan ordered by patient's primary care physician.   DISCHARGE DIAGNOSES:  1.  Diverticulitis with resolution of clinical symptoms and afebrile status      as well as no evidence of leukocytosis this admission, to continue      completion of therapy in the outpatient setting to conclude January 25, 2005.  2.  Incidental findings discovered on outpatient imaging study. CT scan      performed that revealed multiple splenic masses with concerns for      malignancy with 2.7-cm lesion in the superior medial aspect of the      spleen as well as a 2.3-cm cystic lesion in the splenic hilum. These      imaging studies were reviewed with Dr. Maryclare Bean as well as Dr. Jamey Ripa of      general surgery who saw Elaine [name] in consultation. I discussed these      findings with Dr. Myna Hidalgo of hematology/oncology in addition to Dr.      Jamey Ripa of general surgery as well as Dr. Maryclare Bean of interventional      radiology. It was concluded after a lengthy discussion that splenic      biopsy be pursued after resolution of her acute diverticulitis and      inflammation and to follow up with interventional radiology in one to      two weeks for biopsy to be performed and further decision based on the      findings on pathology at that time. If these are inconclusive  Dr. Jamey Ripa      has conveyed that he would likely pursue splenectomy at that point. If      the pathology  is revealing of lymphoma further considerations are to be      explored with oncology consultation/Dr. Myna Hidalgo.  3.  Hypothyroidism.  4.  History of fibromyalgia.  5.  Polymyalgia rheumatica.  6.  Status post tonsillectomy in the past.  7.  Family history of splenic mass and splenectomy in one of her siblings.   MEDICATIONS ON DISCHARGE:  1.  Flagyl 500 mg p.o. t.i.d. to conclude on January 25, 2005.  2.  Cipro 750  mg p.o. b.i.d., to continue through January 25, 2005.  3.  Continue her calcium and multivitamin as before in addition to      levothyroxine.   DISPOSITION:  She is encouraged to not engage in any strenuous activities or  heavy lifting until these issues have been resolved.  She is medically  stabled and has been requesting to go home as she is tolerating a diet and  requesting management and discharge home. She was instructed to call Dr.  Carolee Rota office to follow up within the next one to two weeks. In addition I  have spoken to Dr. Maryclare Bean who has informed me that his office is in the  process of coordinating outpatient follow-up with him splenic biopsy.   Dr. Jamey Ripa will be interested in pathology of his lesion and the  determination of whether or not to proceed to OR further.   HISTORY OF PRESENT ILLNESS:  For full details please refer to H&P as  dictated by Dr. Elliot Cousin. However, briefly, Elaine [name] is a pleasant  77 year old female with a medical history significant for diverticulosis,  colonic polyps, hypothyroidism, who presented to the hospital as a direct  admission from Dr. Carolee Rota office with complaints of abdominal pain and  clinical diagnosis of diverticulitis as well as radiographic evidence of  diverticulitis on outside imaging study/CT scan. In any event, she was  discovered to having concerning splenic findings on the scan and she was  admitted  to the hospital directly for further evaluation and management. Her  hospital course was uneventful. She underwent intravenous therapy with  Flagyl and Cipro. She experienced clinical resolution of her symptoms. She  remained afebrile and exhibited no leukocytosis. She tolerated diet,  conversations regarding where to proceed with regards to her splenic lesions  were undertaken.   As noted above it was concluded that Elaine [name] be best served by  allowing her acute diverticulitis to subside before proceeding to  intervention. She is to undergo biopsy in the outpatient setting and further  management is to ensue from that point.   She did, in anticipation of possible splenectomy, undergo immunization with  strep pneumonia vaccine in addition to a hemophilus influenza vaccine as  well as meningococcal vaccine.   She is seen in medically stable condition on the day of discharge.       ESS/MEDQ  D:  01/17/2005  T:  01/17/2005  Job:  [id number]   cc:   Soyla Murphy. Renne Crigler, M.D.  [address]  [place]  [place] [address]  Fax: [phone]   Currie Paris, M.D.  [address]  [place]  [place] [address]   Art A. Hoss, M.D.  [address]  [place], [place] [address]   Rose Phi. Myna Hidalgo, M.D.  [address] [facility]  [place], [place] [address]  Fax: [phone]

## 2011-01-18 ENCOUNTER — Other Ambulatory Visit: Payer: Self-pay | Admitting: Internal Medicine

## 2011-01-18 DIAGNOSIS — Z1231 Encounter for screening mammogram for malignant neoplasm of breast: Secondary | ICD-10-CM

## 2011-02-25 ENCOUNTER — Ambulatory Visit
Admission: RE | Admit: 2011-02-25 | Discharge: 2011-02-25 | Disposition: A | Discharge status: Home / Self Care | Payer: Medicare Other | Source: Ambulatory Visit | Attending: Internal Medicine | Admitting: Internal Medicine

## 2011-02-25 DIAGNOSIS — Z1231 Encounter for screening mammogram for malignant neoplasm of breast: Secondary | ICD-10-CM

## 2011-12-19 ENCOUNTER — Telehealth: Payer: Self-pay | Admitting: Oncology

## 2011-12-19 NOTE — Telephone Encounter (Signed)
S/w pt re appt for 5/22 w/HH - date per pt

## 2011-12-23 ENCOUNTER — Telehealth: Payer: Self-pay | Admitting: Oncology

## 2011-12-23 NOTE — Telephone Encounter (Signed)
Referred by Dr. Renne Crigler Dx-Leukocytosis

## 2011-12-25 ENCOUNTER — Ambulatory Visit: Payer: Medicare Other

## 2011-12-25 ENCOUNTER — Ambulatory Visit: Payer: Medicare Other | Admitting: Oncology

## 2011-12-25 ENCOUNTER — Other Ambulatory Visit: Payer: Medicare Other | Admitting: Lab

## 2012-01-23 ENCOUNTER — Other Ambulatory Visit: Payer: Self-pay | Admitting: Internal Medicine

## 2012-01-23 DIAGNOSIS — Z1231 Encounter for screening mammogram for malignant neoplasm of breast: Secondary | ICD-10-CM

## 2012-02-26 ENCOUNTER — Ambulatory Visit
Admission: RE | Admit: 2012-02-26 | Discharge: 2012-02-26 | Disposition: A | Discharge status: Home / Self Care | Payer: Medicare Other | Source: Ambulatory Visit | Attending: Internal Medicine | Admitting: Internal Medicine

## 2012-02-26 DIAGNOSIS — Z1231 Encounter for screening mammogram for malignant neoplasm of breast: Secondary | ICD-10-CM

## 2012-04-23 ENCOUNTER — Telehealth: Payer: Self-pay | Admitting: Oncology

## 2012-04-23 NOTE — Telephone Encounter (Signed)
S/W pt in re NP appt 9/30 @ 10:30 W/ Dr. Gaylyn Rong. Referring Dr. Renne Crigler Dx- Leukocytosis NP packet mail.

## 2012-04-24 ENCOUNTER — Encounter: Payer: Self-pay | Admitting: Oncology

## 2012-04-24 ENCOUNTER — Telehealth: Payer: Self-pay | Admitting: Oncology

## 2012-04-24 DIAGNOSIS — D72829 Elevated white blood cell count, unspecified: Secondary | ICD-10-CM | POA: Insufficient documentation

## 2012-04-24 NOTE — Telephone Encounter (Signed)
C/D on 9/20 for appt 9/30 °

## 2012-05-03 DIAGNOSIS — C482 Malignant neoplasm of peritoneum, unspecified: Secondary | ICD-10-CM | POA: Insufficient documentation

## 2012-05-03 NOTE — Patient Instructions (Addendum)
1.  Issue:  Elevated white blood cell (leukocytosis).   2.  Possibility:  Intermittent increased vs Reactive vs Primary bone marrow disease.  My view of your blood smear today did not show concern for acute leukemia/lymphoma.   Your WBC today is lower than when you had with your PCP.  This argues against acute leukemia. 3.  Work up:  Blood test sent today.  If anything abnormal, further work up may be considered.  4.  Recommendations: repeat blood count at the [facility] in about 3, 6, and 9 months.  Follow up in about 1 year.

## 2012-05-04 ENCOUNTER — Other Ambulatory Visit (HOSPITAL_BASED_OUTPATIENT_CLINIC_OR_DEPARTMENT_OTHER): Payer: Medicare Other | Admitting: Lab

## 2012-05-04 ENCOUNTER — Ambulatory Visit: Payer: Medicare Other

## 2012-05-04 ENCOUNTER — Ambulatory Visit (HOSPITAL_BASED_OUTPATIENT_CLINIC_OR_DEPARTMENT_OTHER): Payer: Medicare Other | Admitting: Oncology

## 2012-05-04 ENCOUNTER — Encounter: Payer: Self-pay | Admitting: Oncology

## 2012-05-04 ENCOUNTER — Telehealth: Payer: Self-pay | Admitting: Oncology

## 2012-05-04 VITALS — BP 147/73 | HR 71 | Temp 98.9°F | Resp 20 | Ht 62.5 in | Wt 144.4 lb

## 2012-05-04 DIAGNOSIS — D72829 Elevated white blood cell count, unspecified: Secondary | ICD-10-CM

## 2012-05-04 DIAGNOSIS — C482 Malignant neoplasm of peritoneum, unspecified: Secondary | ICD-10-CM

## 2012-05-04 DIAGNOSIS — D473 Essential (hemorrhagic) thrombocythemia: Secondary | ICD-10-CM

## 2012-05-04 LAB — CBC WITH DIFFERENTIAL/PLATELET
Basophils Absolute: 0.1 10*3/uL (ref 0.0–0.1)
EOS%: 1.2 % (ref 0.0–7.0)
LYMPH%: 40.5 % (ref 14.0–49.7)
MCH: 31.1 pg (ref 25.1–34.0)
MCHC: 32.7 g/dL (ref 31.5–36.0)
MONO#: 0.7 10*3/uL (ref 0.1–0.9)
NEUT#: 5.7 10*3/uL (ref 1.5–6.5)
NEUT%: 51.1 % (ref 38.4–76.8)

## 2012-05-04 LAB — MORPHOLOGY

## 2012-05-04 LAB — CA 125: CA 125: 16.8 U/mL (ref 0.0–30.2)

## 2012-05-04 LAB — COMPREHENSIVE METABOLIC PANEL (CC13)
ALT: 17 U/L (ref 0–55)
Albumin: 3.7 g/dL (ref 3.5–5.0)
CO2: 25 mEq/L (ref 22–29)
Sodium: 139 mEq/L (ref 136–145)
Total Protein: 6.9 g/dL (ref 6.4–8.3)

## 2012-05-04 LAB — CHCC SMEAR

## 2012-05-04 NOTE — Telephone Encounter (Signed)
appts made and printed for pt aom °

## 2012-05-04 NOTE — Progress Notes (Signed)
Checked in new patient. Had no financial issues at this time.

## 2012-05-04 NOTE — Progress Notes (Signed)
[facility]  Telephone:([phone]) [phone] [phone]     INITIAL HEMATOLOGY CONSULTATION    Referral MD:  Dr. Merri Brunette, M.D.  Reason for Referral: leukocytosis.     HPI:  Elaine [name] is a 78 year-old woman with history of peritoneal carcinoma in 2006.  She presented with a large LUQ mass and lung met.  She underwent resection of primary disease, splenectomy, and lung met resection, and has been in NED.  The last CT scan on 08/02/2010 from [facility] showed evidence of recurrence or metastatic disease.  She has been following with Dr. Renne Crigler.  A routine CBC was obtained on 11/07/2011 which showed WBC 13.3; Hgb 12.0; Plt 507; neutrophil 52%.  This was repeated on 04/14/2012 which showed WBC 12.8; Hgb 11.8; Plt 388.  Giver persistent leukocytosis, she was kindly referred to the [facility] for evaluation.   Elaine [name] presented to the clinic by herself today.  She denied fever, recurrent infection, bleeding, fatigue, recurrent infection.  She has had mild sorethroat for the last few months and some hoarse voice.  She was evaluated by [facility] and was thought to have GERD causing these symptoms.  She was prescribed PPI with some improvement of her symptoms.  However, over the past 2 months, she has had persistent painful left level II in the angle of the left jaw.  She denied other adenopathy.   Patient denies fever, anorexia, weight loss, fatigue, headache, visual changes, confusion, drenching night sweats, mucositis, odynophagia, dysphagia, nausea vomiting, jaundice, chest pain, palpitation, shortness of breath, dyspnea on exertion, productive cough, gum bleeding, epistaxis, hematemesis, hemoptysis, abdominal pain, abdominal swelling, early satiety, melena, hematochezia, hematuria, skin rash, spontaneous bleeding, joint swelling, joint pain, heat or cold intolerance, bowel bladder incontinence, back pain, focal motor weakness, paresthesia, depression, suicidal or  homicidal ideation, feeling hopelessness.       Past Medical History  Diagnosis Date  . Leukocytosis   . Hypothyroid   . Osteopenia   . Peritoneal carcinoma 2006    with lung met; at [facility]; Dr. Elenora Gamma.   :    Past Surgical History  Procedure Date  . Splenectomy 2006  . Abdominal hysterectomy 2006  . Total abdominal hysterectomy w/ bilateral salpingoophorectomy 2006  . Oophorectomy 2006  . Tonsillectomy   . Colonoscopy 2012    neg.   :   CURRENT MEDS: Current Outpatient Prescriptions  Medication Sig Dispense Refill  . alendronate (FOSAMAX) 70 MG tablet 70 mg Once a week.      . calcium carbonate (OS-CAL) 600 MG TABS Take 600 mg by mouth 2 (two) times daily with a meal.      . cholecalciferol (VITAMIN D) 1000 UNITS tablet Take 1,000 Units by mouth daily.      . fish oil-omega-3 fatty acids 1000 MG capsule Take 1,200 mg by mouth daily.      Marland Kitchen levothyroxine (SYNTHROID, LEVOTHROID) 50 MCG tablet 50 mcg Daily.      . Multiple Vitamins-Minerals (MULTIVITAMIN WITH MINERALS) tablet Take 1 tablet by mouth daily.      . niacin 250 MG tablet Take 250 mg by mouth daily with breakfast.      . omeprazole (PRILOSEC) 20 MG capsule 20 mg Daily.          Allergies  Allergen Reactions  . Aleve (Naproxen Sodium) Swelling  . Belladonna Alkaloids Other (See Comments)    Facial edema  . Percocet (Oxycodone-Acetaminophen) Nausea And Vomiting  . Anzemet (Dolasetron) Rash  .  Oxycontin (Oxycodone Hcl Er) Rash  . Sulfamethoxazole W-Trimethoprim Rash  . Vicodin (Hydrocodone-Acetaminophen) Rash  :  Family History  Problem Relation Age of Onset  . Heart failure Mother   . Cancer Father     colon  . Lung disease Father     miner  . Cancer Sister     leukemia  :  History   Social History  . Marital Status: Divorced    Spouse Name: N/A    Number of Children: 3  . Years of Education: N/A   Occupational History  .      retired Retail banker   Social History Main  Topics  . Smoking status: Former Smoker -- 0.2 packs/day for 5 years    Quit date: 08/05/1978  . Smokeless tobacco: Never Used  . Alcohol Use: No  . Drug Use: No  . Sexually Active:    Other Topics Concern  . Not on file   Social History Narrative  . No narrative on file  :  REVIEW OF SYSTEM:  The rest of the 14-point review of sytem was negative.   Exam: ECOG 0  General:  well-nourished woman, in no acute distress.  Eyes:  no scleral icterus.  ENT:  There were no oropharyngeal lesions.  Neck was without thyromegaly.  Lymphatics:  Positive for a left level II, angle of the jaw about 2x1cm node, mildly tender to palpation.  Negative supraclavicular or axillary adenopathy.  Respiratory: lungs were clear bilaterally without wheezing or crackles.  Cardiovascular:  Regular rate and rhythm, S1/S2, without murmur, rub or gallop.  There was no pedal edema.  GI:  abdomen was soft, flat, nontender, nondistended, without hepatomegaly.  Muscoloskeletal:  no spinal tenderness of palpation of vertebral spine.  Skin exam was without echymosis, petichae.  Neuro exam was nonfocal.  Patient was able to get on and off exam table without assistance.  Gait was normal.  Patient was alerted and oriented.  Attention was good.   Language was appropriate.  Mood was normal without depression.  Speech was not pressured.  Thought content was not tangential.    LABS:  Lab Results  Component Value Date   WBC 11.2* 05/04/2012   HGB 12.1 05/04/2012   HCT 37.0 05/04/2012   PLT 449* 05/04/2012   GLUCOSE 98 05/04/2012   ALT 17 05/04/2012   AST 18 05/04/2012   NA 139 05/04/2012   K 4.7 05/04/2012   CL 104 05/04/2012   CREATININE 0.8 05/04/2012   BUN 11.0 05/04/2012   CO2 25 05/04/2012    Blood smear review:   I personally reviewed the patient's peripheral blood smear today.  There was isocytosis.  There was no peripheral blast.  There was no schistocytosis, spherocytosis, target cell, rouleaux formation, tear drop cell.   There was no giant platelets or platelet clumps.      ASSESSMENT AND PLAN:   1.  History of peritoneal carcinomatosis with met to the lung in 2006 s/p resection with NED.  Last CT scan in 07/2010 showed NED.   2.  Leukocytosis:  Most likely reactive or due to splenectomy.  This is chronic at least as far back as 11/2011.  Before, that I do not have access to her CBC.  My review of her blood smear to day was not concerning for myeloproliferative or lymphoproliferative disease.    3.  Thrombocytosis:  Again, most likely due to splenectomy history.  However, I cannot rule out essential thrombocytosis.  Therefore, I sent  for JAK-2 mutation to evaluate this possibility.  4.  Left cervical level II node:  Only 2x1 cm; most likely reactive.  However, since it has been there for 2 months and coupled with residual sorethroat/hoarse voice.  I appreciate if Dr. Lazarus Salines can see her back to see if it's indicated to biopsy this node (given her history of metastatic peritoneal carcinomatosis).  This would be a rare pattern of spread up to head/neck area.   5.  Follow up:  Lab only appointment at the [facility] in 3,6, and 9 months to follow her CBC.  Return visit in about 1 year but sooner if left cervical node returns positive.    Thank you for this referral.   The length of time of the face-to-face encounter was 30 minutes. More than 50% of time was spent counseling and coordination of care.

## 2012-05-11 ENCOUNTER — Other Ambulatory Visit (HOSPITAL_COMMUNITY)
Admission: RE | Admit: 2012-05-11 | Discharge: 2012-05-11 | Disposition: A | Discharge status: Home / Self Care | Payer: Medicare Other | Source: Ambulatory Visit | Attending: Otolaryngology | Admitting: Otolaryngology

## 2012-05-11 ENCOUNTER — Other Ambulatory Visit: Payer: Self-pay | Admitting: Otolaryngology

## 2012-05-11 DIAGNOSIS — K1120 Sialoadenitis, unspecified: Secondary | ICD-10-CM | POA: Insufficient documentation

## 2012-05-12 ENCOUNTER — Other Ambulatory Visit: Payer: Self-pay | Admitting: Otolaryngology

## 2012-05-12 DIAGNOSIS — D37039 Neoplasm of uncertain behavior of the major salivary glands, unspecified: Secondary | ICD-10-CM

## 2012-05-18 ENCOUNTER — Ambulatory Visit
Admission: RE | Admit: 2012-05-18 | Discharge: 2012-05-18 | Disposition: A | Discharge status: Home / Self Care | Payer: Medicare Other | Source: Ambulatory Visit | Attending: Otolaryngology | Admitting: Otolaryngology

## 2012-05-18 DIAGNOSIS — D37039 Neoplasm of uncertain behavior of the major salivary glands, unspecified: Secondary | ICD-10-CM

## 2012-05-18 IMAGING — CT CT NECK W/ CM
4 of 5 series · 17 of 33 positions shown, 19 images · IV contrast (75CC OMNI 300)
Comparison: None.

CLINICAL DATA: Left salivary gland neoplasm.  Left ear pain for
past month.  History of ovarian cancer and lung cancer.

CT NECK WITH CONTRAST
TECHNIQUE: Multidetector CT imaging of the neck was performed with
intravenous contrast.
Contrast: 75mL OMNIPAQUE IOHEXOL 300 MG/ML  SOLN

[Series 3: axial neck · axial · 0.49mm/px · z∈[-37,+103]mm · 5 of 85 slices shown]
[im 15/85  bone]
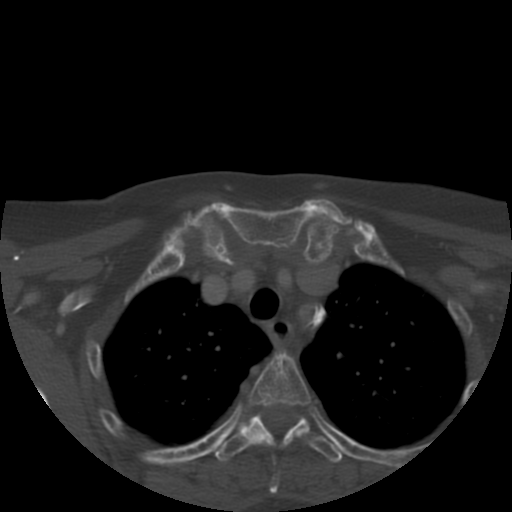
[im 29/85  bone]
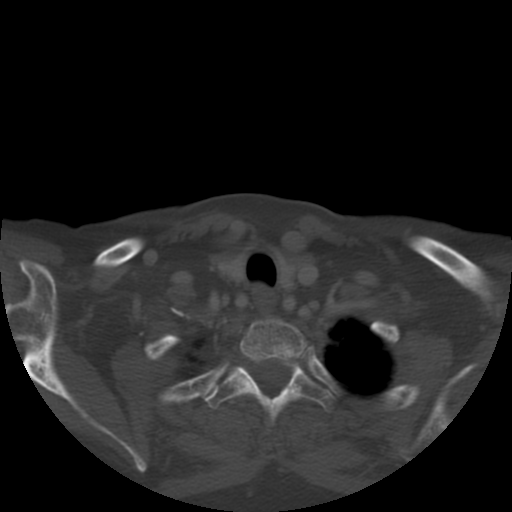
[im 43/85  bone]
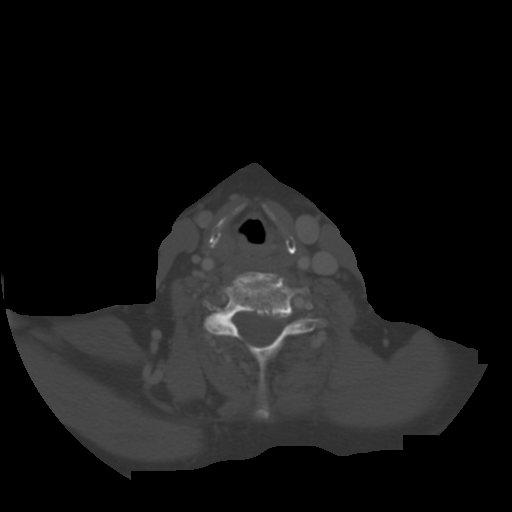
[im 57/85  bone]
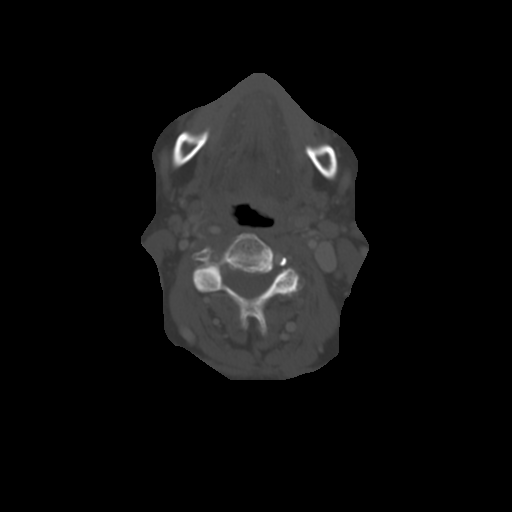
[im 71/85  bone]
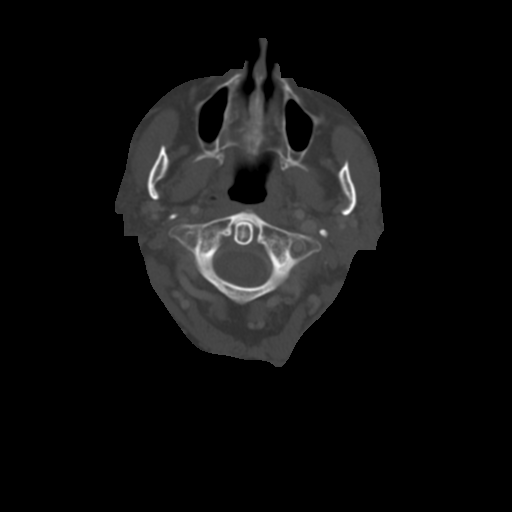

[Series 200: cor · coronal · 0.49mm/px · 3 of 82 slices shown]
[im 26/82  bone]
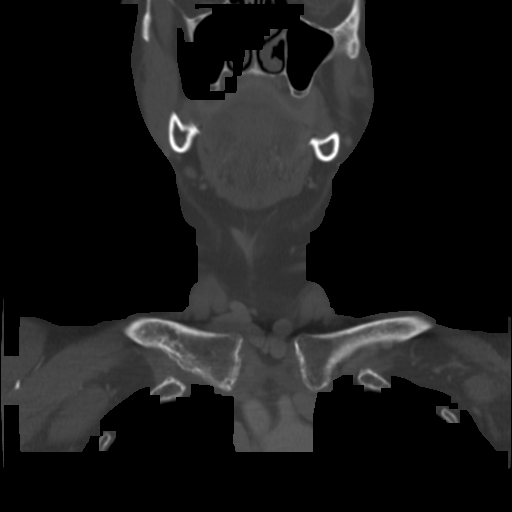
[im 36/82  bone]
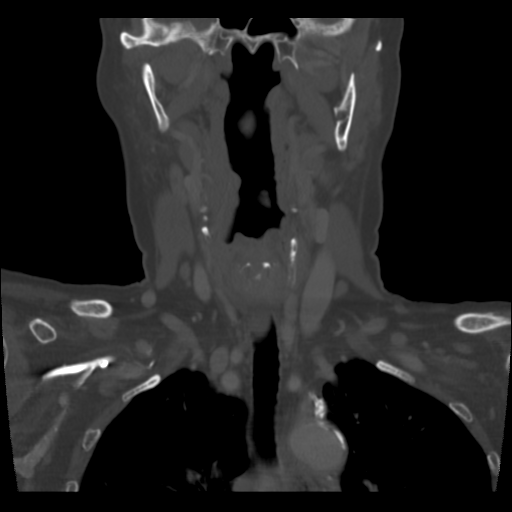
[im 46/82  bone]
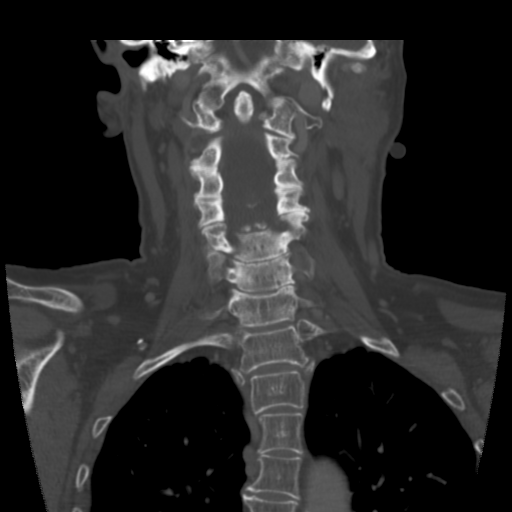

[Series 201: ang axial · axial · 0.49mm/px · z∈[-73,+44]mm · 4 of 86 slices shown, 5 images]
[im 18/86  soft-tissue]
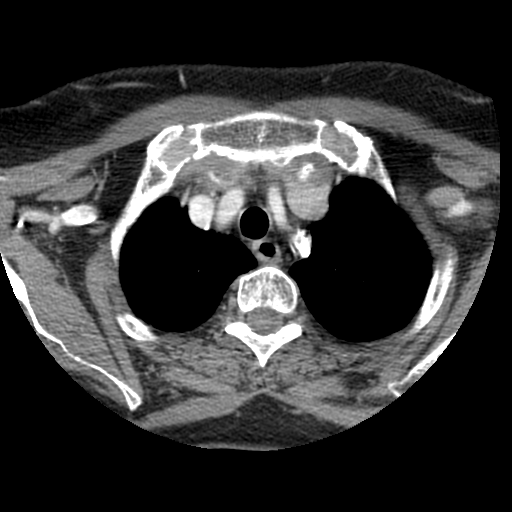
[im 18/86  bone]
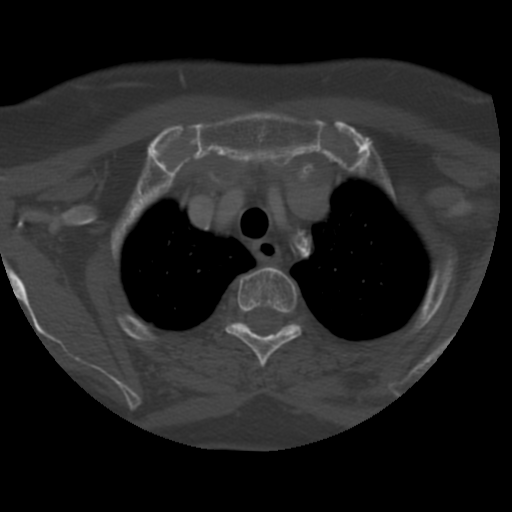
[im 35/86  bone]
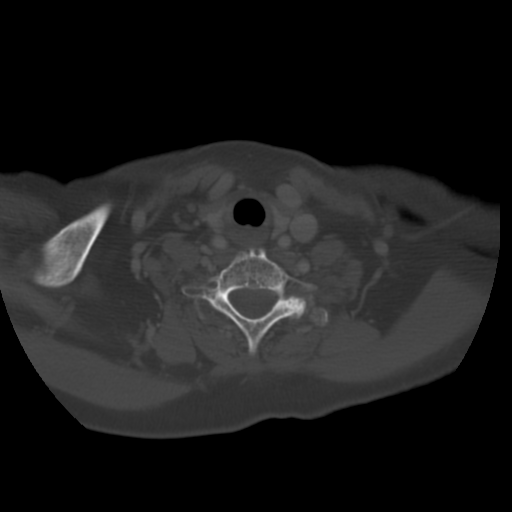
[im 52/86  bone]
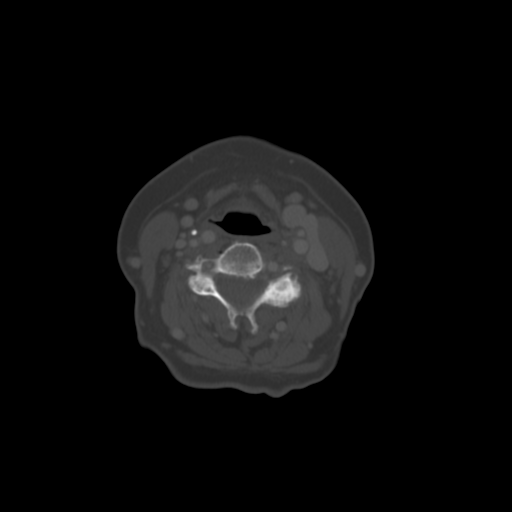
[im 69/86  bone]
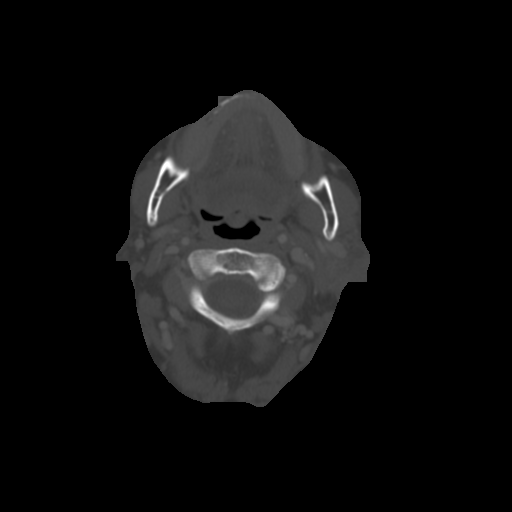

[Series 202: sag · sagittal · 0.49mm/px · 5 of 75 slices shown, 6 images]
[im 25/75  bone]
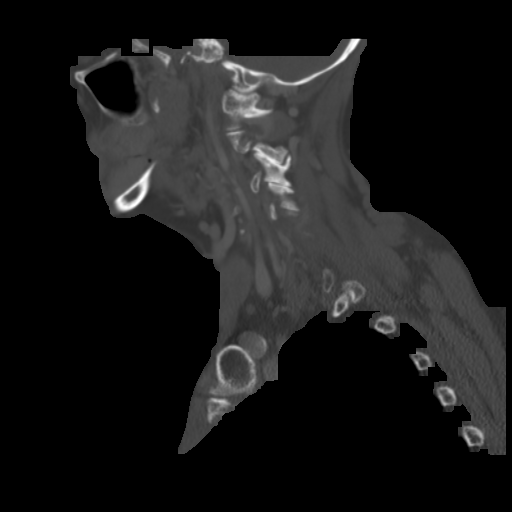
[im 31/75  bone]
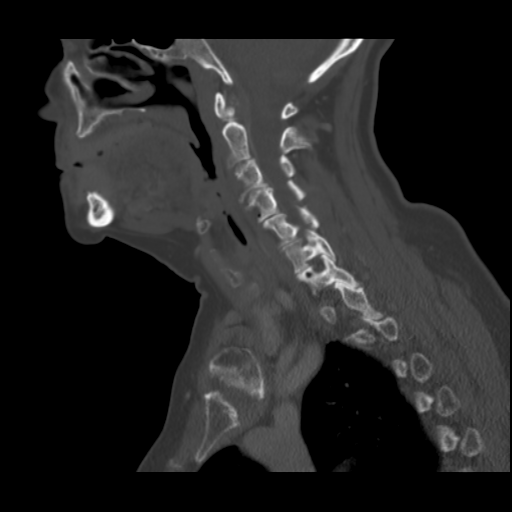
[im 38/75  soft-tissue]
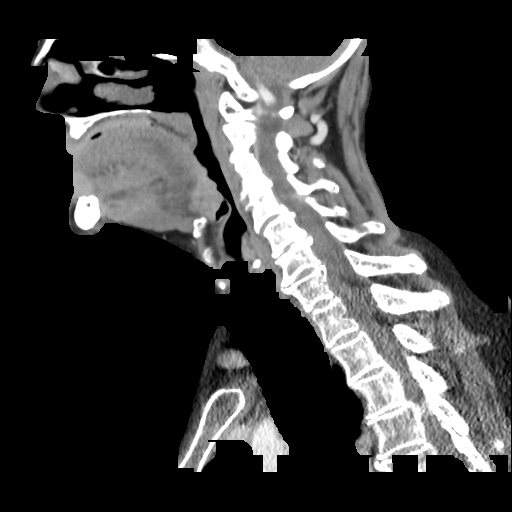
[im 38/75  bone]
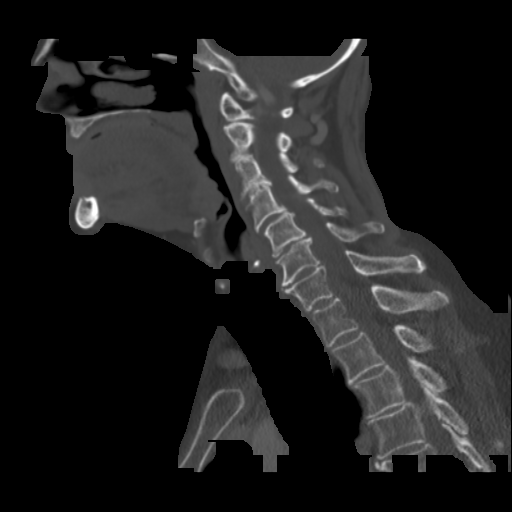
[im 44/75  bone]
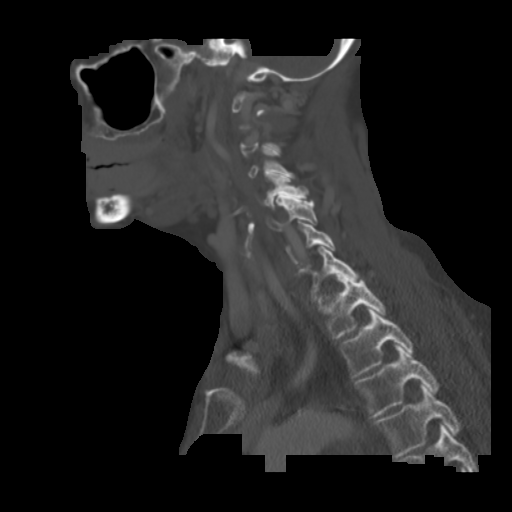
[im 50/75  bone]
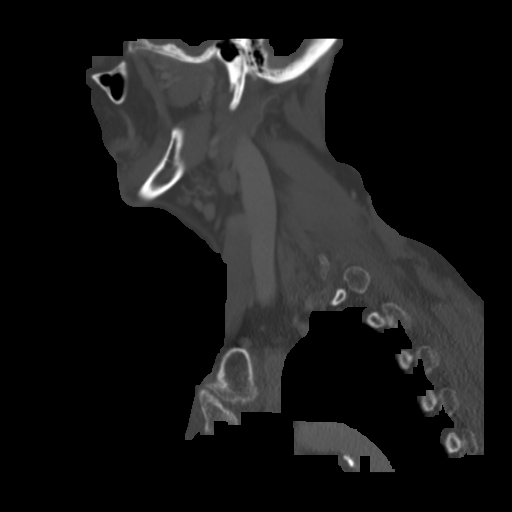

[17 of 33 positions shown; findings below may reference images not displayed]

FINDINGS: Asymmetric appearance of the parotid glands with dense
appearance of the left parotid gland versus the right.  The left
parotid duct is prominent in size without stone seen. The cause of
this left parotid duct enlargement/inflammation is therefore
indeterminate.  Stricture not excluded.

History provided states left salivary gland neoplasm.  Tumor cannot
be excluded however, the CT appearance is more suggestive of
inflammatory process.

Increased number of normal to top normal sized lymph nodes
throughout the neck bilaterally including level II, III and IV
lymph nodes with largest lymph node left level II region measuring
1.3 x 0 .8 x 1.5 cm.

Atherosclerotic type changes aortic arch, great vessels and carotid
bifurcation without evidence of hemodynamically significant
stenosis.

Cervical spondylotic changes with various degrees of spinal
stenosis and foraminal narrowing without worrisome bony destructive
lesion.

Visualized lungs are clear.
IMPRESSION: Enlarged left parotid duct and increased density of the left
parotid gland in a diffuse fashion more suggestive of inflammatory
process than  malignancy as noted above.

## 2012-05-18 MED ORDER — IOHEXOL 300 MG/ML  SOLN
75.0000 mL | Freq: Once | INTRAMUSCULAR | Status: AC | PRN
  Administered 2012-05-18: 75 mL via INTRAVENOUS

## 2012-08-03 ENCOUNTER — Telehealth: Payer: Self-pay | Admitting: *Deleted

## 2012-08-03 ENCOUNTER — Other Ambulatory Visit (HOSPITAL_BASED_OUTPATIENT_CLINIC_OR_DEPARTMENT_OTHER): Payer: Medicare Other | Admitting: Lab

## 2012-08-03 DIAGNOSIS — D72829 Elevated white blood cell count, unspecified: Secondary | ICD-10-CM

## 2012-08-03 DIAGNOSIS — C482 Malignant neoplasm of peritoneum, unspecified: Secondary | ICD-10-CM

## 2012-08-03 LAB — CBC WITH DIFFERENTIAL/PLATELET
BASO%: 0.7 % (ref 0.0–2.0)
LYMPH%: 44.3 % (ref 14.0–49.7)
MCHC: 32.9 g/dL (ref 31.5–36.0)
MCV: 94.8 fL (ref 79.5–101.0)
MONO%: 7.1 % (ref 0.0–14.0)
Platelets: 377 10*3/uL (ref 145–400)
RBC: 3.82 10*6/uL (ref 3.70–5.45)
RDW: 14.1 % (ref 11.2–14.5)
WBC: 12.9 10*3/uL — ABNORMAL HIGH (ref 3.9–10.3)

## 2012-08-03 NOTE — Telephone Encounter (Signed)
Message copied by Reesa Chew on Mon Aug 03, 2012  4:09 PM ------      Message from: Su Hilt C      Created: Mon Aug 03, 2012  3:15 PM                   ----- Message -----         From: Exie Parody, MD         Sent: 08/03/2012  11:42 AM           To: Marlowe Aschoff, RN            Please call patient.  Her WBC is still slightly elevated (due to hx of splenectomy).  I again recommended watchful observation for now.  Thanks.

## 2012-09-19 ENCOUNTER — Other Ambulatory Visit: Payer: Self-pay

## 2012-10-26 ENCOUNTER — Other Ambulatory Visit (HOSPITAL_BASED_OUTPATIENT_CLINIC_OR_DEPARTMENT_OTHER): Payer: Medicare Other | Admitting: Lab

## 2012-10-26 DIAGNOSIS — C482 Malignant neoplasm of peritoneum, unspecified: Secondary | ICD-10-CM

## 2012-10-26 DIAGNOSIS — D72829 Elevated white blood cell count, unspecified: Secondary | ICD-10-CM

## 2012-10-26 LAB — CBC WITH DIFFERENTIAL/PLATELET
BASO%: 0.4 % (ref 0.0–2.0)
LYMPH%: 35 % (ref 14.0–49.7)
MCHC: 33.7 g/dL (ref 31.5–36.0)
MONO#: 0.9 10*3/uL (ref 0.1–0.9)
NEUT#: 8.3 10*3/uL — ABNORMAL HIGH (ref 1.5–6.5)
RBC: 3.77 10*6/uL (ref 3.70–5.45)
RDW: 13.8 % (ref 11.2–14.5)
WBC: 14.4 10*3/uL — ABNORMAL HIGH (ref 3.9–10.3)
lymph#: 5.1 10*3/uL — ABNORMAL HIGH (ref 0.9–3.3)

## 2013-01-18 ENCOUNTER — Other Ambulatory Visit: Payer: Self-pay

## 2013-01-18 DIAGNOSIS — Z1231 Encounter for screening mammogram for malignant neoplasm of breast: Secondary | ICD-10-CM

## 2013-01-25 ENCOUNTER — Other Ambulatory Visit (HOSPITAL_BASED_OUTPATIENT_CLINIC_OR_DEPARTMENT_OTHER): Payer: Medicare Other | Admitting: Lab

## 2013-01-25 DIAGNOSIS — C482 Malignant neoplasm of peritoneum, unspecified: Secondary | ICD-10-CM

## 2013-01-25 DIAGNOSIS — D72829 Elevated white blood cell count, unspecified: Secondary | ICD-10-CM

## 2013-01-25 LAB — CBC WITH DIFFERENTIAL/PLATELET
BASO%: 0.7 % (ref 0.0–2.0)
Eosinophils Absolute: 0.3 10*3/uL (ref 0.0–0.5)
HCT: 36.1 % (ref 34.8–46.6)
HGB: 12.2 g/dL (ref 11.6–15.9)
MCHC: 33.8 g/dL (ref 31.5–36.0)
MONO#: 0.8 10*3/uL (ref 0.1–0.9)
NEUT#: 6.7 10*3/uL — ABNORMAL HIGH (ref 1.5–6.5)
NEUT%: 48.2 % (ref 38.4–76.8)
WBC: 13.8 10*3/uL — ABNORMAL HIGH (ref 3.9–10.3)
lymph#: 6 10*3/uL — ABNORMAL HIGH (ref 0.9–3.3)

## 2013-03-10 ENCOUNTER — Other Ambulatory Visit: Payer: Self-pay

## 2013-03-29 ENCOUNTER — Ambulatory Visit
Admission: RE | Admit: 2013-03-29 | Discharge: 2013-03-29 | Disposition: A | Discharge status: Home / Self Care | Payer: Medicare Other | Source: Ambulatory Visit

## 2013-03-29 DIAGNOSIS — Z1231 Encounter for screening mammogram for malignant neoplasm of breast: Secondary | ICD-10-CM

## 2013-04-23 ENCOUNTER — Other Ambulatory Visit: Payer: Self-pay | Admitting: *Deleted

## 2013-04-23 DIAGNOSIS — C482 Malignant neoplasm of peritoneum, unspecified: Secondary | ICD-10-CM

## 2013-04-23 DIAGNOSIS — D72829 Elevated white blood cell count, unspecified: Secondary | ICD-10-CM

## 2013-04-26 ENCOUNTER — Encounter: Payer: Self-pay | Admitting: Oncology

## 2013-04-26 ENCOUNTER — Other Ambulatory Visit (HOSPITAL_BASED_OUTPATIENT_CLINIC_OR_DEPARTMENT_OTHER): Payer: Medicare Other | Admitting: Lab

## 2013-04-26 ENCOUNTER — Ambulatory Visit (HOSPITAL_BASED_OUTPATIENT_CLINIC_OR_DEPARTMENT_OTHER): Payer: Medicare Other | Admitting: Oncology

## 2013-04-26 VITALS — BP 151/68 | HR 60 | Temp 98.0°F | Resp 20 | Ht 62.5 in | Wt 142.9 lb

## 2013-04-26 DIAGNOSIS — C482 Malignant neoplasm of peritoneum, unspecified: Secondary | ICD-10-CM

## 2013-04-26 DIAGNOSIS — D72829 Elevated white blood cell count, unspecified: Secondary | ICD-10-CM

## 2013-04-26 LAB — CBC WITH DIFFERENTIAL/PLATELET
BASO%: 1 % (ref 0.0–2.0)
EOS%: 1.3 % (ref 0.0–7.0)
HCT: 36.6 % (ref 34.8–46.6)
MCH: 31 pg (ref 25.1–34.0)
MCHC: 32.8 g/dL (ref 31.5–36.0)
MONO#: 0.7 10*3/uL (ref 0.1–0.9)
RBC: 3.88 10*6/uL (ref 3.70–5.45)
RDW: 14.1 % (ref 11.2–14.5)
WBC: 11.8 10*3/uL — ABNORMAL HIGH (ref 3.9–10.3)
lymph#: 5 10*3/uL — ABNORMAL HIGH (ref 0.9–3.3)

## 2013-04-26 LAB — COMPREHENSIVE METABOLIC PANEL (CC13)
AST: 16 U/L (ref 5–34)
Albumin: 3.5 g/dL (ref 3.5–5.0)
BUN: 12.7 mg/dL (ref 7.0–26.0)
CO2: 27 mEq/L (ref 22–29)
Calcium: 9.8 mg/dL (ref 8.4–10.4)
Chloride: 102 mEq/L (ref 98–109)
Creatinine: 0.8 mg/dL (ref 0.6–1.1)
Glucose: 96 mg/dl (ref 70–140)
Potassium: 4.5 mEq/L (ref 3.5–5.1)

## 2013-04-26 LAB — MORPHOLOGY

## 2013-04-26 NOTE — Progress Notes (Signed)
[facility]  Telephone:(336) [phone] Fax:(336) [phone]   OFFICE PROGRESS NOTE   Cc:  Elaine Moh, MD  DIAGNOSIS: Leukocytosis, likely reactive or due to prior splenectomy. The patient also has a history of perineal carcinomatosis with lung metastasis in 2006. She is status post resection with NED. She is followed by Elaine [name] at [facility].  CURRENT THERAPY: Watchful observation for leukocytosis.  INTERVAL HISTORY: Elaine [name] 79 y.o. female returns for routine followup by herself. The patient denies fevers, recurrent infections, bleeding, fatigue. She has been traveling a lot over the past year. She is also active and walks on a daily basis. Denies chest pain, shortness of breath, dyspnea on exertion. No bleeding. Prior painful left cervical lymph node is no longer palpable. She is status post incision and drainage and biopsy of this area. Biopsy showed an abscess and no malignant cells. The patient is due to followup at [facility] in October for a CT scan.  Past Medical History  Diagnosis Date  . Leukocytosis   . Hypothyroid   . Osteopenia   . Peritoneal carcinoma 2006    with lung met; at [facility]; Elaine [name].     Past Surgical History  Procedure Laterality Date  . Splenectomy  2006  . Abdominal hysterectomy  2006  . Total abdominal hysterectomy w/ bilateral salpingoophorectomy  2006  . Oophorectomy  2006  . Tonsillectomy    . Colonoscopy  2012    neg.     Current Outpatient Prescriptions  Medication Sig Dispense Refill  . calcium carbonate (TUMS - DOSED IN MG ELEMENTAL CALCIUM) 500 MG chewable tablet Chew 1 tablet by mouth as needed for heartburn.      Marland Kitchen alendronate (FOSAMAX) 70 MG tablet 70 mg Once a week.      . calcium carbonate (OS-CAL) 600 MG TABS Take 600 mg by mouth [name] (two) times daily with a meal.      . cholecalciferol (VITAMIN D) 1000 UNITS tablet Take 1,000 Units by mouth daily.      . fish oil-omega-3 fatty acids  1000 MG capsule Take 4,800 mg by mouth daily.       Marland Kitchen levothyroxine (SYNTHROID, LEVOTHROID) 50 MCG tablet 50 mcg Daily.      . Multiple Vitamins-Minerals (MULTIVITAMIN WITH MINERALS) tablet Take 1 tablet by mouth daily.      . niacin 250 MG tablet Take 250 mg by mouth daily with breakfast.       No current facility-administered medications for this visit.    ALLERGIES:  is allergic to aleve; belladonna alkaloids; percocet; anzemet; oxycontin; sulfamethoxazole w-trimethoprim; and vicodin.  REVIEW OF SYSTEMS:  The rest of the 14-point review of system was negative.   Filed Vitals:   04/26/13 1102  BP: 151/68  Pulse: 60  Temp: 98 F (36.7 C)  Resp: 20   Wt Readings from Last 3 Encounters:  04/26/13 142 lb 14.4 oz (64.819 kg)  05/04/12 144 lb 6.4 oz (65.499 kg)   ECOG Performance status: 0  PHYSICAL EXAMINATION:   General:  well-nourished in no acute distress.  Eyes:  no scleral icterus.  ENT:  There were no oropharyngeal lesions.  Neck was without thyromegaly.  Lymphatics:  Negative cervical, supraclavicular or axillary adenopathy.  Respiratory: lungs were clear bilaterally without wheezing or crackles.  Cardiovascular:  Regular rate and rhythm, S1/S2, without murmur, rub or gallop.  There was no pedal edema.  GI:  abdomen was soft, flat, nontender, nondistended, without organomegaly.  Muscoloskeletal:  no spinal tenderness of palpation of vertebral spine.  Skin exam was without echymosis, petichae.  Neuro exam was nonfocal.  Patient was able to get on and off exam table without assistance.  Gait was normal.  Patient was alerted and oriented.  Attention was good.   Language was appropriate.  Mood was normal without depression.  Speech was not pressured.  Thought content was not tangential.     LABORATORY/RADIOLOGY DATA:  Lab Results  Component Value Date   WBC 11.8* 04/26/2013   HGB 12.0 04/26/2013   HCT 36.6 04/26/2013   PLT 426* 04/26/2013   GLUCOSE 96 04/26/2013   ALKPHOS 45  04/26/2013   ALT 12 04/26/2013   AST 16 04/26/2013   NA 139 04/26/2013   K 4.5 04/26/2013   CL 104 05/04/2012   CREATININE 0.8 04/26/2013   BUN 12.7 04/26/2013   CO2 27 04/26/2013    Mm Digital Screening  03/29/2013   *RADIOLOGY REPORT*  Clinical Data: Screening.  DIGITAL SCREENING BILATERAL MAMMOGRAM WITH CAD  Comparison:  Previous exam(s).  FINDINGS:  ACR Breast Density Category b:  There are scattered areas of fibroglandular density.  There are no findings suspicious for malignancy.  Images were processed with CAD.  IMPRESSION: No mammographic evidence of malignancy.  A result letter of this screening mammogram will be mailed directly to the patient.  RECOMMENDATION: Screening mammogram in one year. (Code:SM-B-01Y)  BI-RADS CATEGORY 1:  Negative.   Original Report Authenticated By: Elaine [name], M.D.    ASSESSMENT AND PLAN:   1. History of peritoneal carcinomatosis with met to the lung in 2006 s/p resection with NED. Due to follow up with Dr Elaine [name] later this year..  [name]. Leukocytosis: Most likely reactive or due to splenectomy. This is chronic at least as far back as 11/2011. Before, that I do not have access to her CBC. Her white blood cell count has remained stable. Recommend continued observation. 3. Thrombocytosis: Again, most likely due to splenectomy history. Elaine [name] mutation was negative.  4. Left cervical level II node: Status post incision and drainage and biopsy of this area. No malignant cells were identified. Findings are most consistent with an abscess. This has now resolved. 5. Follow up: Lab only appointment at the [facility] in 3,6, and 9 months to follow her CBC. Return visit in about 1 year. The patient plans to discuss this with Dr. Noland [name] at her next visit to determine if he thinks that she should continue routine followup. She is aware that she may cancel her appointments if she decides to do so.      The length of time of the face-to-face encounter was 15 minutes. More than  50% of time was spent counseling and coordination of care.

## 2013-04-29 ENCOUNTER — Telehealth: Payer: Self-pay | Admitting: Hematology and Oncology

## 2013-04-29 NOTE — Telephone Encounter (Signed)
s.w. pt and advised on dec, march, june, and sept appts...pt ok and aware

## 2013-06-10 ENCOUNTER — Other Ambulatory Visit: Payer: Self-pay

## 2013-07-19 ENCOUNTER — Other Ambulatory Visit (HOSPITAL_BASED_OUTPATIENT_CLINIC_OR_DEPARTMENT_OTHER): Payer: Medicare Other

## 2013-07-19 ENCOUNTER — Telehealth: Payer: Self-pay | Admitting: Hematology and Oncology

## 2013-07-19 DIAGNOSIS — D72829 Elevated white blood cell count, unspecified: Secondary | ICD-10-CM

## 2013-07-19 LAB — CBC WITH DIFFERENTIAL/PLATELET
BASO%: 0.8 % (ref 0.0–2.0)
Basophils Absolute: 0.1 10*3/uL (ref 0.0–0.1)
EOS%: 1.8 % (ref 0.0–7.0)
HGB: 12.1 g/dL (ref 11.6–15.9)
MCH: 31.7 pg (ref 25.1–34.0)
MONO#: 1 10*3/uL — ABNORMAL HIGH (ref 0.1–0.9)
RDW: 13.6 % (ref 11.2–14.5)
WBC: 13.9 10*3/uL — ABNORMAL HIGH (ref 3.9–10.3)
lymph#: 5.7 10*3/uL — ABNORMAL HIGH (ref 0.9–3.3)

## 2013-07-19 NOTE — Telephone Encounter (Signed)
pt came by and got appt calendar

## 2013-10-11 ENCOUNTER — Other Ambulatory Visit (HOSPITAL_BASED_OUTPATIENT_CLINIC_OR_DEPARTMENT_OTHER): Payer: Medicare Other

## 2013-10-11 DIAGNOSIS — D72829 Elevated white blood cell count, unspecified: Secondary | ICD-10-CM

## 2013-10-11 LAB — CBC WITH DIFFERENTIAL/PLATELET
BASO%: 0.8 % (ref 0.0–2.0)
Basophils Absolute: 0.1 10*3/uL (ref 0.0–0.1)
EOS%: 1.5 % (ref 0.0–7.0)
Eosinophils Absolute: 0.2 10*3/uL (ref 0.0–0.5)
HCT: 36.4 % (ref 34.8–46.6)
HGB: 12 g/dL (ref 11.6–15.9)
LYMPH%: 42.3 % (ref 14.0–49.7)
MCH: 31.1 pg (ref 25.1–34.0)
MCHC: 32.8 g/dL (ref 31.5–36.0)
MCV: 94.9 fL (ref 79.5–101.0)
MONO#: 1 10*3/uL — AB (ref 0.1–0.9)
MONO%: 8 % (ref 0.0–14.0)
NEUT#: 6.1 10*3/uL (ref 1.5–6.5)
NEUT%: 47.4 % (ref 38.4–76.8)
PLATELETS: 388 10*3/uL (ref 145–400)
RBC: 3.84 10*6/uL (ref 3.70–5.45)
RDW: 13.9 % (ref 11.2–14.5)
WBC: 12.9 10*3/uL — AB (ref 3.9–10.3)
lymph#: 5.5 10*3/uL — ABNORMAL HIGH (ref 0.9–3.3)

## 2013-12-30 ENCOUNTER — Other Ambulatory Visit: Payer: Self-pay | Admitting: Internal Medicine

## 2013-12-30 DIAGNOSIS — R0989 Other specified symptoms and signs involving the circulatory and respiratory systems: Secondary | ICD-10-CM

## 2014-01-03 ENCOUNTER — Encounter: Payer: Self-pay | Admitting: Oncology

## 2014-01-03 ENCOUNTER — Other Ambulatory Visit (HOSPITAL_BASED_OUTPATIENT_CLINIC_OR_DEPARTMENT_OTHER): Payer: Medicare Other

## 2014-01-03 DIAGNOSIS — D72829 Elevated white blood cell count, unspecified: Secondary | ICD-10-CM

## 2014-01-03 LAB — CBC WITH DIFFERENTIAL/PLATELET
BASO%: 1.1 % (ref 0.0–2.0)
BASOS ABS: 0.1 10*3/uL (ref 0.0–0.1)
EOS ABS: 0.3 10*3/uL (ref 0.0–0.5)
EOS%: 2.2 % (ref 0.0–7.0)
HEMATOCRIT: 36.7 % (ref 34.8–46.6)
HGB: 12 g/dL (ref 11.6–15.9)
LYMPH%: 44.5 % (ref 14.0–49.7)
MCH: 31.1 pg (ref 25.1–34.0)
MCHC: 32.7 g/dL (ref 31.5–36.0)
MCV: 94.9 fL (ref 79.5–101.0)
MONO#: 0.9 10*3/uL (ref 0.1–0.9)
MONO%: 7.4 % (ref 0.0–14.0)
NEUT#: 5.8 10*3/uL (ref 1.5–6.5)
NEUT%: 44.8 % (ref 38.4–76.8)
Platelets: 409 10*3/uL — ABNORMAL HIGH (ref 145–400)
RBC: 3.87 10*6/uL (ref 3.70–5.45)
RDW: 13.5 % (ref 11.2–14.5)
WBC: 12.8 10*3/uL — AB (ref 3.9–10.3)
lymph#: 5.7 10*3/uL — ABNORMAL HIGH (ref 0.9–3.3)

## 2014-01-24 ENCOUNTER — Ambulatory Visit
Admission: RE | Admit: 2014-01-24 | Discharge: 2014-01-24 | Disposition: A | Discharge status: Home / Self Care | Payer: Medicare Other | Source: Ambulatory Visit | Attending: Internal Medicine | Admitting: Internal Medicine

## 2014-01-24 DIAGNOSIS — R0989 Other specified symptoms and signs involving the circulatory and respiratory systems: Secondary | ICD-10-CM

## 2014-01-24 IMAGING — US US CAROTID DUPLEX BILAT
1 series · 13 of 24 positions shown · non-contrast
Comparison: CT scan of the neck [DATE]

CLINICAL DATA: Left carotid bruit

EXAM:
BILATERAL CAROTID DUPLEX ULTRASOUND
TECHNIQUE: Gray scale imaging, color Doppler and duplex ultrasound were
performed of bilateral carotid and vertebral arteries in the neck.

[Series 1: us carotid duplex bilat · 0.11mm/px · 13 of 64 slices shown]
[im 1/64]
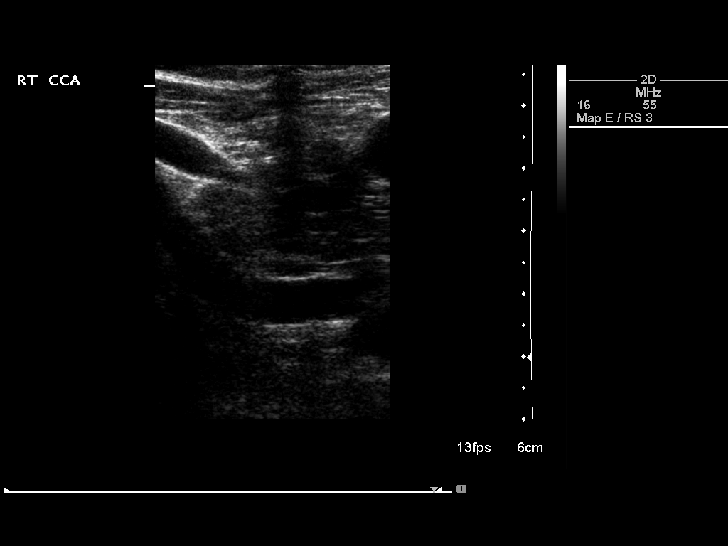
[im 6/64]
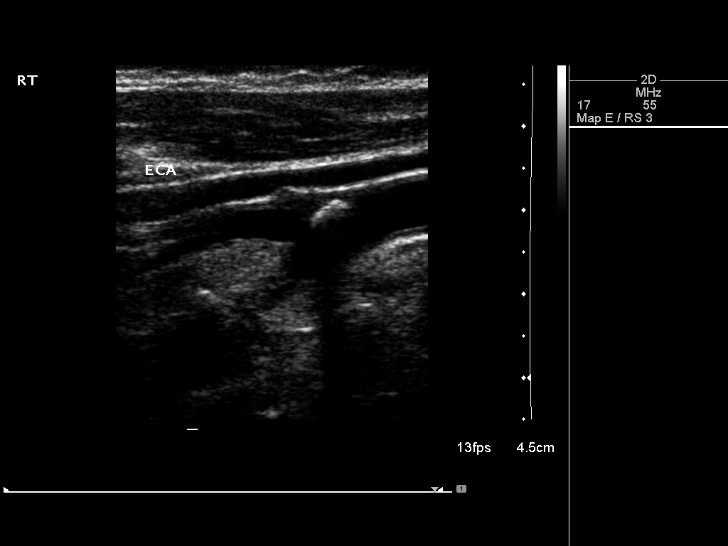
[im 11/64]
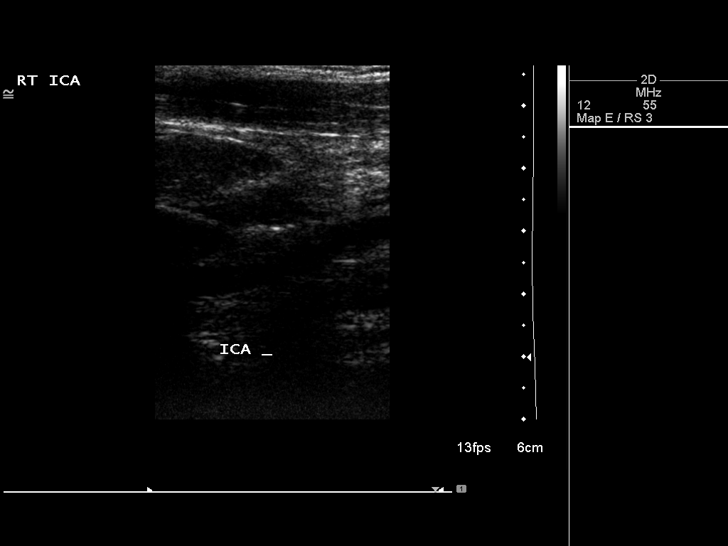
[im 17/64]
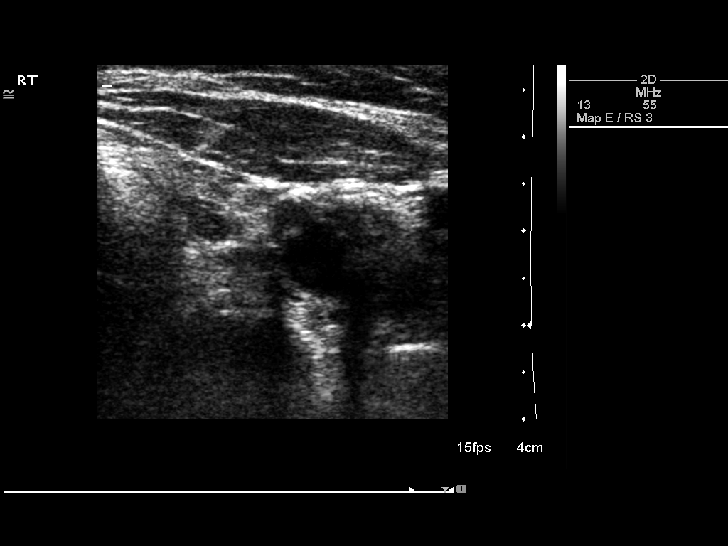
[im 22/64]
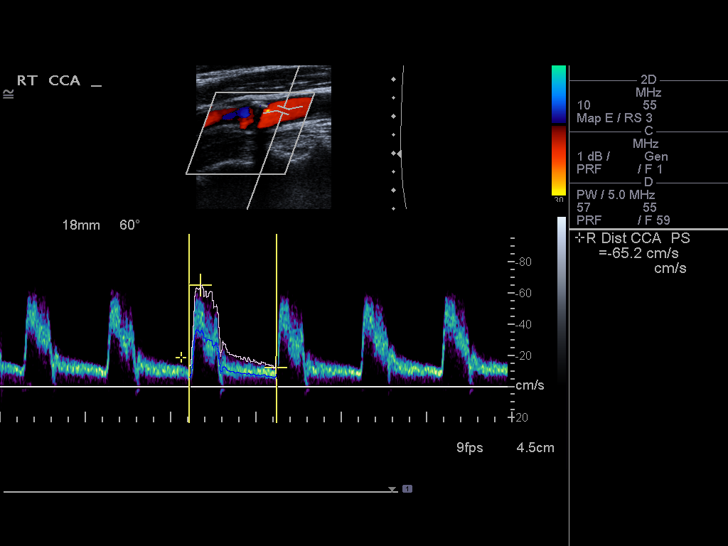
[im 28/64]
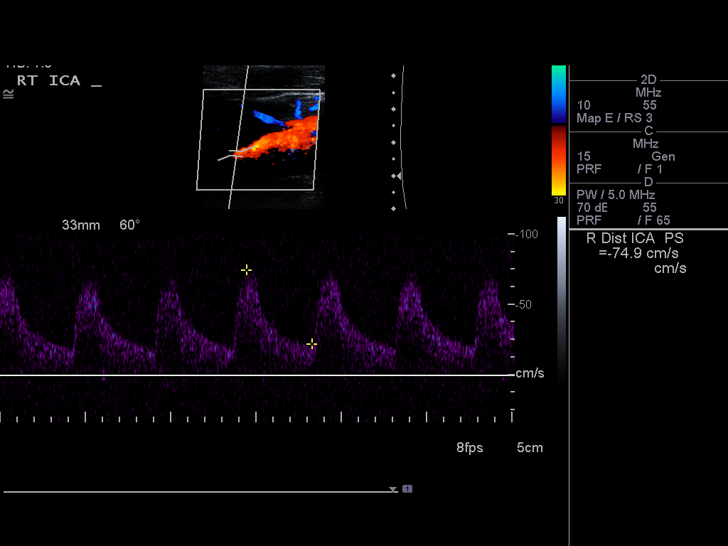
[im 33/64]
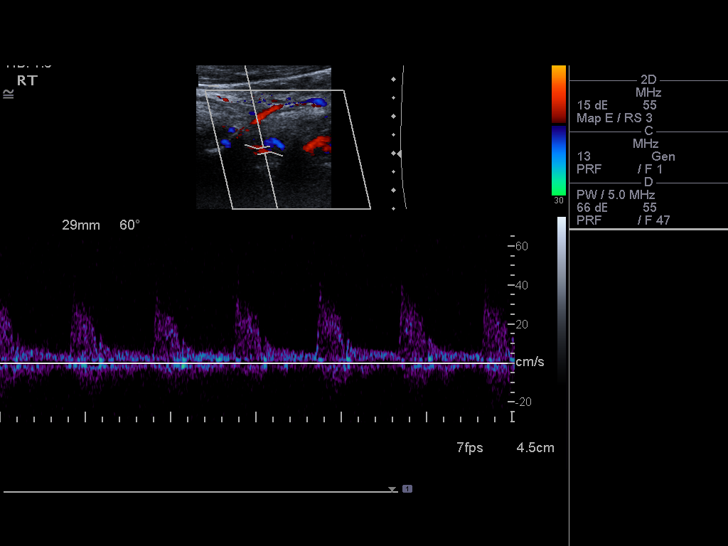
[im 36/64]
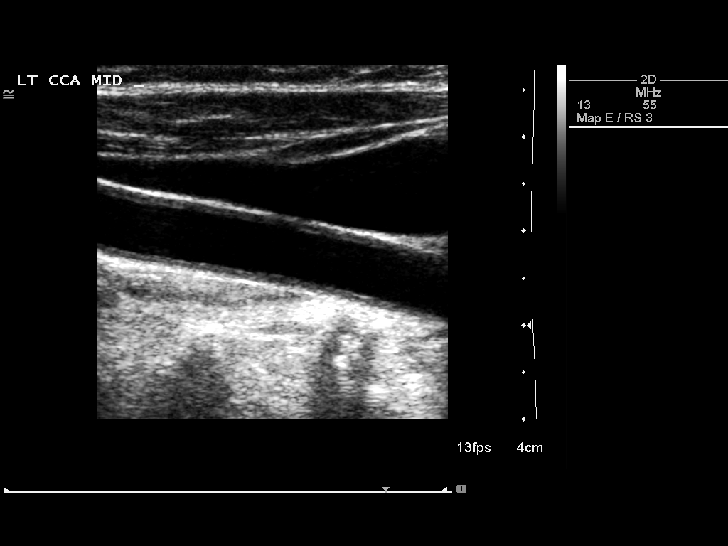
[im 42/64]
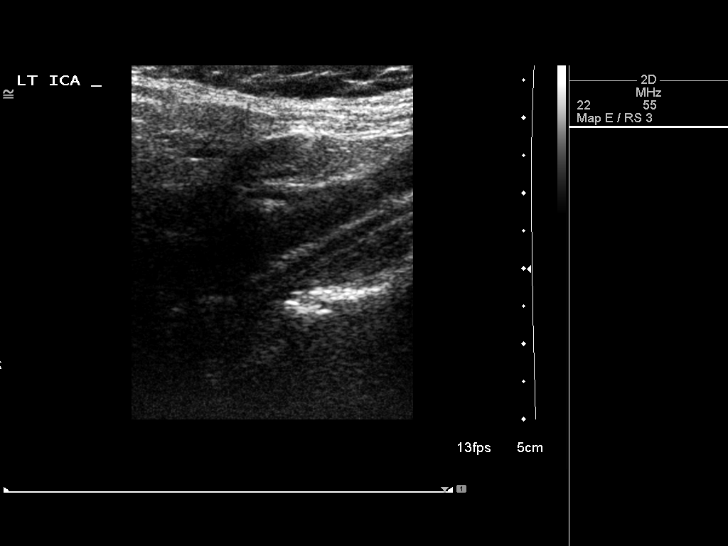
[im 47/64]
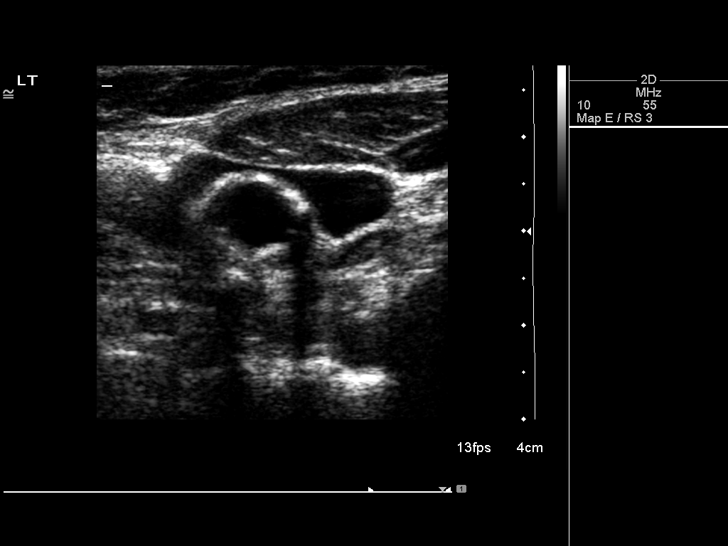
[im 53/64]
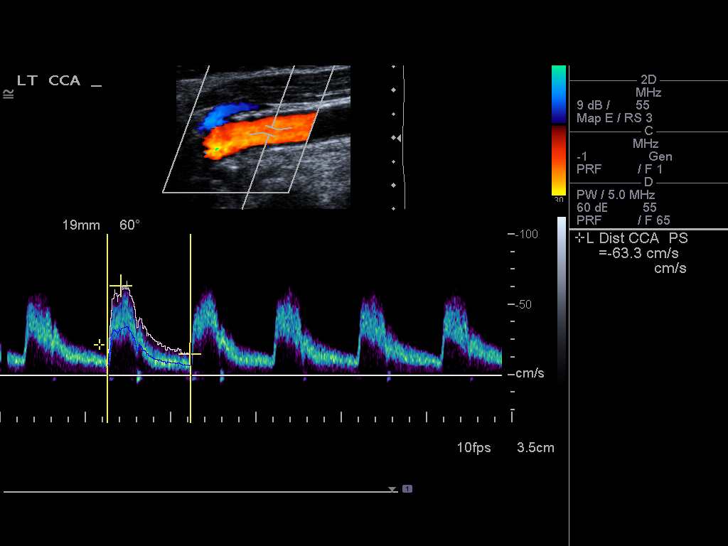
[im 58/64]
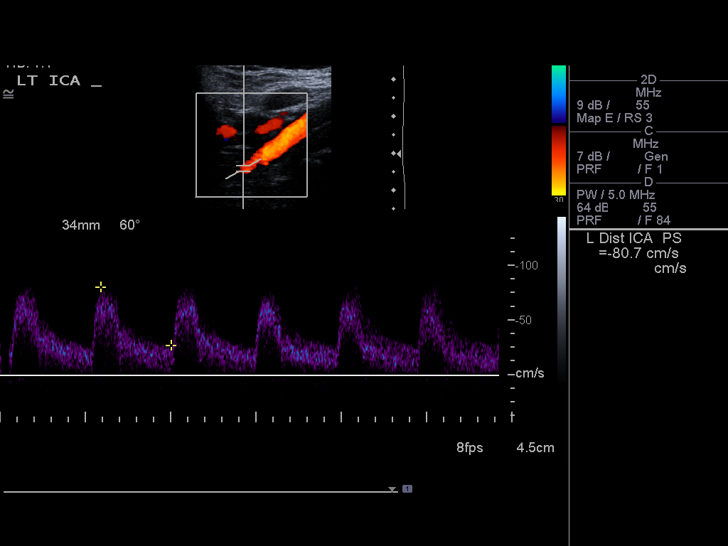
[im 64/64]
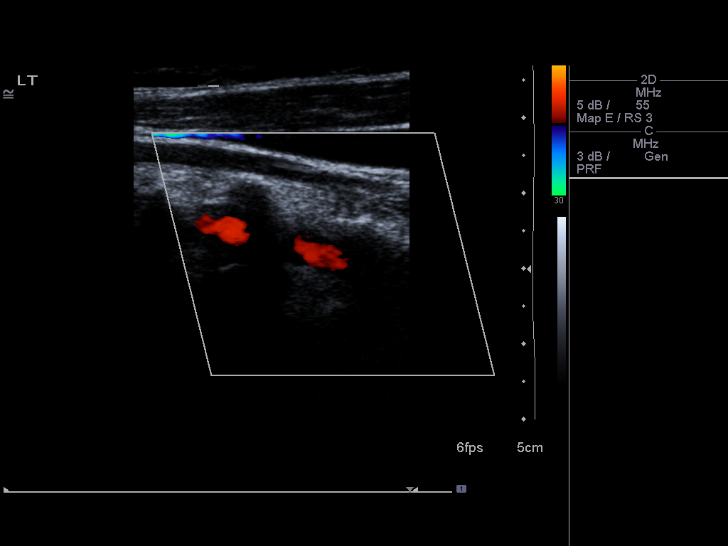

[13 of 24 positions shown; findings below may reference images not displayed]

FINDINGS: Criteria: Quantification of carotid stenosis is based on velocity
parameters that correlate the residual internal carotid diameter
with NASCET-based stenosis levels, using the diameter of the distal
internal carotid lumen as the denominator for stenosis measurement.

The following velocity measurements were obtained:

RIGHT

ICA:  75/22 cm/sec

CCA:  78/15 cm/sec

SYSTOLIC ICA/CCA RATIO:

DIASTOLIC ICA/CCA RATIO:

ECA:  63 cm/sec

LEFT

ICA:  100/29 cm/sec

CCA:  85/17 cm/sec

SYSTOLIC ICA/CCA RATIO:

DIASTOLIC ICA/CCA RATIO:

ECA:  81 cm/sec

RIGHT CAROTID ARTERY: Focal partially calcified heterogeneous
atherosclerotic plaque in the carotid bifurcation extending into the
external carotid artery. No evidence of internal carotid stenosis.

RIGHT VERTEBRAL ARTERY:  Patent with normal antegrade flow.

LEFT CAROTID ARTERY: Heterogeneous and slightly irregular
atherosclerotic plaque in the carotid bifurcation extending into the
proximal internal carotid artery. By peak systolic velocity criteria
the stenosis remains less than 50%.

LEFT VERTEBRAL ARTERY:  Patent with normal antegrade flow.
IMPRESSION: 1. Heterogeneous, irregular and partially calcified atherosclerotic
plaque results in a less than 50% left ICA stenosis.
2. Mild focal heterogeneous atherosclerotic plaque on the right
without evidence of ICA stenosis.
3. Vertebral arteries are patent with normal antegrade flow.

## 2014-01-28 ENCOUNTER — Telehealth: Payer: Self-pay | Admitting: Hematology and Oncology

## 2014-01-28 NOTE — Telephone Encounter (Signed)
PT RETURNED MY CALL AND LEFT MESSAGE ON VM CONFIRMING SHE DOES WANT TO CX SEPT LB/FU. PT INITIALLY CALLED AND ASKED THAT ALL LABS HERE BE CX DUE TO THEY WERE NO LONGER NEEDED. PT WAS ONLY ON SCHEDULE FOR LB/FU IN SEPT AND I IN TURN LEFT HER A MESSAGE RE APPT FOR LB/FU IN SEPT ONLY AND ASKED FOR CLARIFICATION. I RETURNED PT'S CALL TODAY AND S/W HER TO CONFIRM LB/FU IN SEPT CX. PER PT SHE WAS SUPPOSED TO BE DOING QUARTERLY LABS AND HER ONCOLOGIST IN [place] SAYS THOSE LABS ARE NO LONGER NEEDED. ALL APPTS HERE CXD AND MESSAGE SENT TO DESK NURSE.

## 2014-03-07 ENCOUNTER — Other Ambulatory Visit: Payer: Self-pay

## 2014-03-07 DIAGNOSIS — Z1231 Encounter for screening mammogram for malignant neoplasm of breast: Secondary | ICD-10-CM

## 2014-03-30 ENCOUNTER — Ambulatory Visit
Admission: RE | Admit: 2014-03-30 | Discharge: 2014-03-30 | Disposition: A | Discharge status: Home / Self Care | Payer: Medicare Other | Source: Ambulatory Visit

## 2014-03-30 DIAGNOSIS — Z1231 Encounter for screening mammogram for malignant neoplasm of breast: Secondary | ICD-10-CM

## 2014-04-05 ENCOUNTER — Ambulatory Visit: Payer: Medicare Other | Admitting: Hematology and Oncology

## 2014-04-05 ENCOUNTER — Other Ambulatory Visit: Payer: Medicare Other

## 2015-01-30 ENCOUNTER — Other Ambulatory Visit: Payer: Self-pay

## 2015-02-22 ENCOUNTER — Other Ambulatory Visit: Payer: Self-pay | Admitting: Internal Medicine

## 2015-02-22 DIAGNOSIS — Z1231 Encounter for screening mammogram for malignant neoplasm of breast: Secondary | ICD-10-CM

## 2015-04-17 ENCOUNTER — Ambulatory Visit
Admission: RE | Admit: 2015-04-17 | Discharge: 2015-04-17 | Disposition: A | Discharge status: Home / Self Care | Payer: Medicare Other | Source: Ambulatory Visit | Attending: Internal Medicine | Admitting: Internal Medicine

## 2015-04-17 DIAGNOSIS — Z1231 Encounter for screening mammogram for malignant neoplasm of breast: Secondary | ICD-10-CM

## 2016-03-07 ENCOUNTER — Other Ambulatory Visit: Payer: Self-pay | Admitting: Internal Medicine

## 2016-03-07 DIAGNOSIS — Z1231 Encounter for screening mammogram for malignant neoplasm of breast: Secondary | ICD-10-CM

## 2016-04-17 ENCOUNTER — Ambulatory Visit
Admission: RE | Admit: 2016-04-17 | Discharge: 2016-04-17 | Disposition: A | Discharge status: Home / Self Care | Payer: Medicare Other | Source: Ambulatory Visit | Attending: Internal Medicine | Admitting: Internal Medicine

## 2016-04-17 DIAGNOSIS — Z1231 Encounter for screening mammogram for malignant neoplasm of breast: Secondary | ICD-10-CM

## 2016-04-17 IMAGING — MG DIGITAL SCREENING BILATERAL MAMMOGRAM WITH CAD
4 series · 4 of 4 positions shown · non-contrast
Comparison: Previous exam(s).

CLINICAL DATA: Screening.

EXAM:
DIGITAL SCREENING BILATERAL MAMMOGRAM WITH CAD

[L CC]
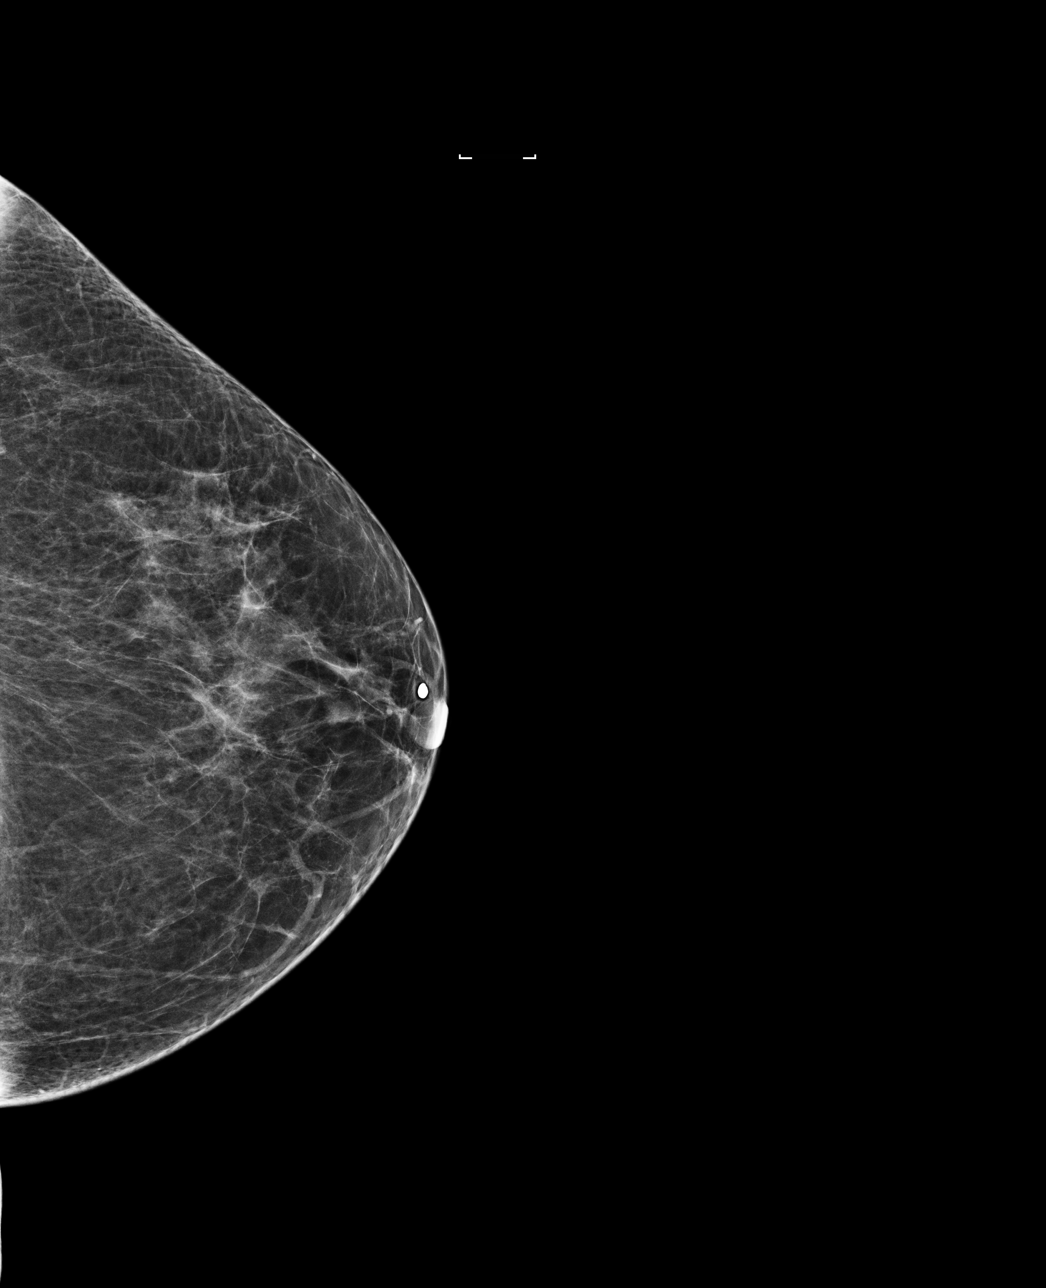

[R MLO]
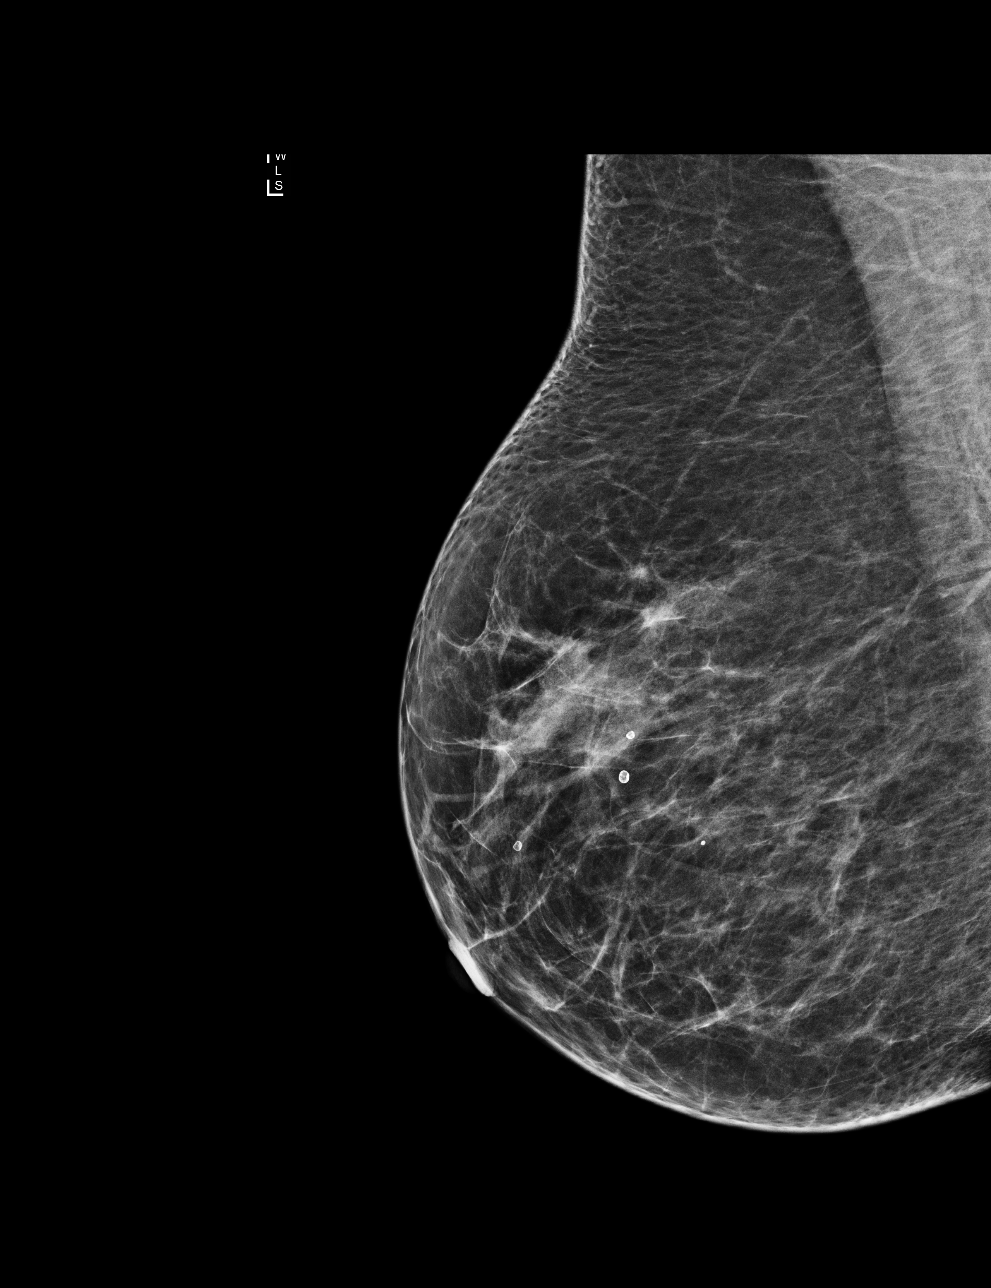

[L MLO]
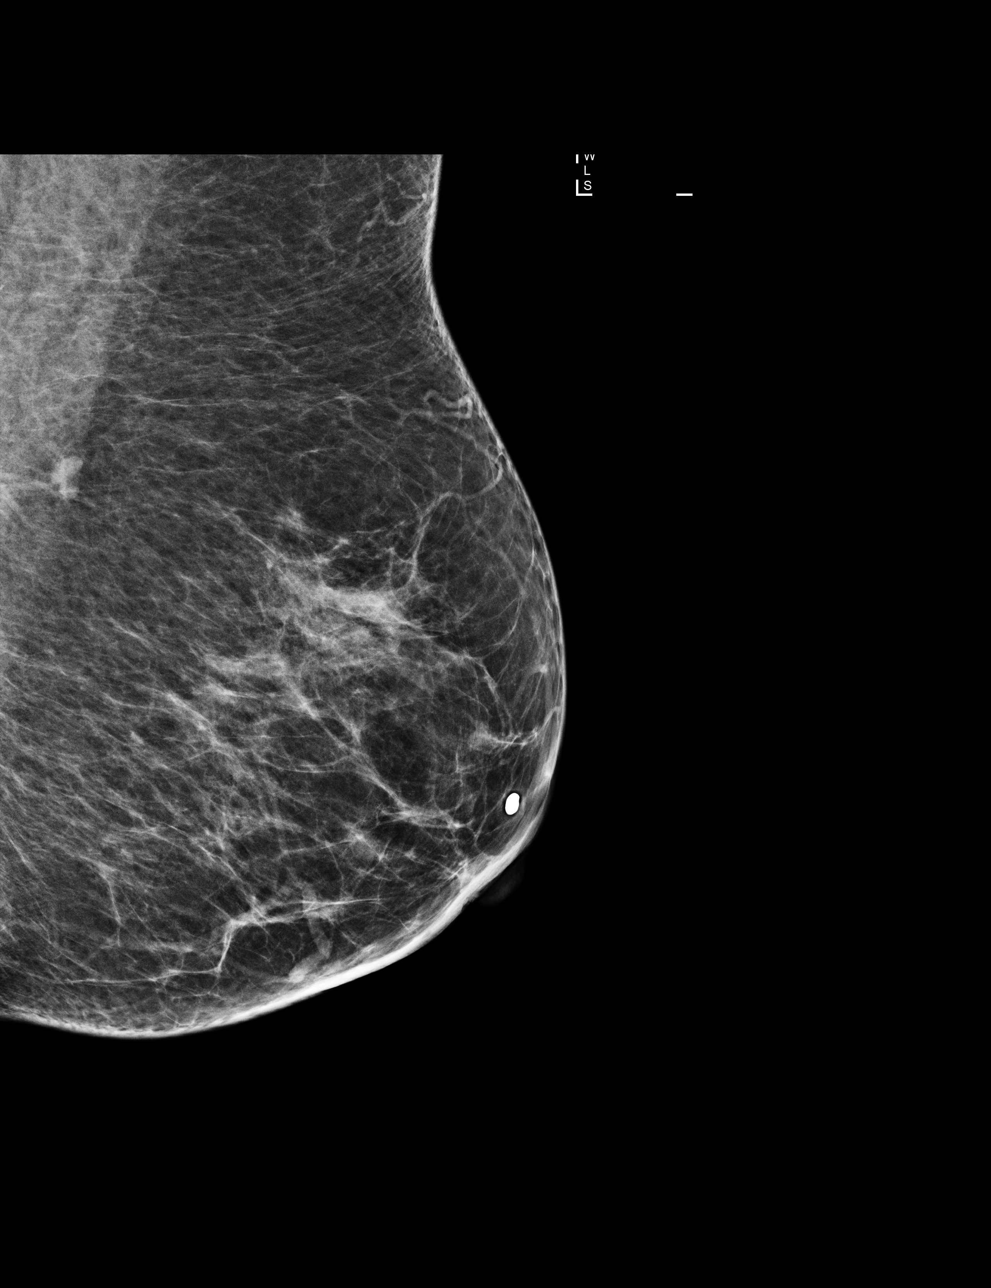

[R CC]
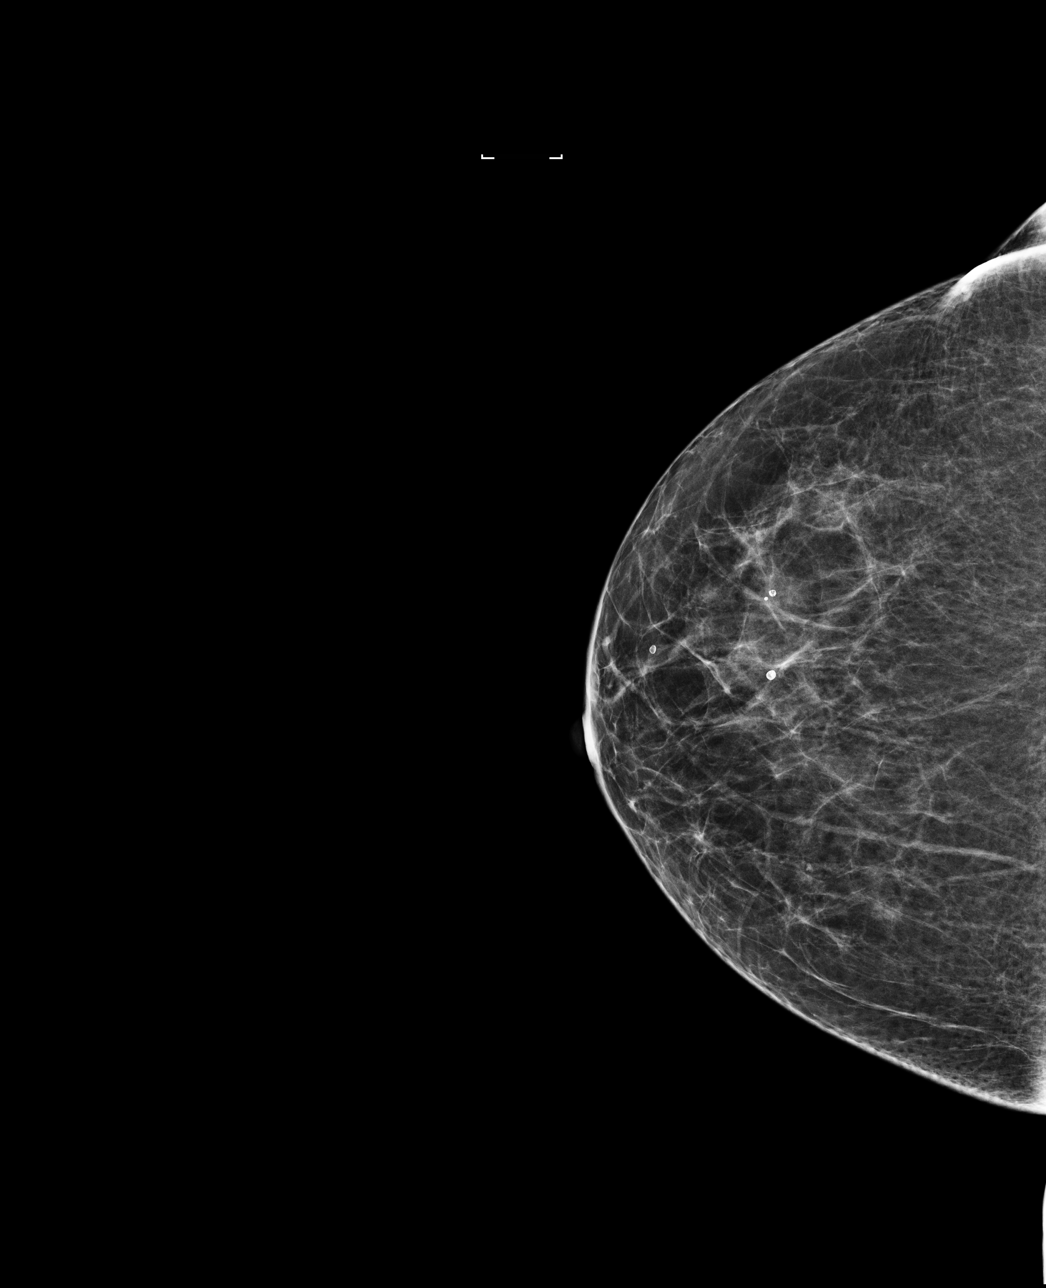

[4 of 4 positions shown; findings below may reference images not displayed]

ACR Breast Density Category b: There are scattered areas of
fibroglandular density.
FINDINGS: There are no findings suspicious for malignancy. Images were
processed with CAD.
IMPRESSION: No mammographic evidence of malignancy. A result letter of this
screening mammogram will be mailed directly to the patient.

RECOMMENDATION:
Screening mammogram in one year. (Code:[US])

BI-RADS CATEGORY  1: Negative.

## 2016-12-07 ENCOUNTER — Emergency Department (HOSPITAL_BASED_OUTPATIENT_CLINIC_OR_DEPARTMENT_OTHER)
Admission: EM | Admit: 2016-12-07 | Discharge: 2016-12-08 | Disposition: A | Discharge status: Home / Self Care | Payer: Medicare Other | Attending: Emergency Medicine | Admitting: Emergency Medicine

## 2016-12-07 ENCOUNTER — Encounter (HOSPITAL_BASED_OUTPATIENT_CLINIC_OR_DEPARTMENT_OTHER): Payer: Self-pay | Admitting: *Deleted

## 2016-12-07 DIAGNOSIS — T1591XA Foreign body on external eye, part unspecified, right eye, initial encounter: Secondary | ICD-10-CM

## 2016-12-07 DIAGNOSIS — Y929 Unspecified place or not applicable: Secondary | ICD-10-CM | POA: Insufficient documentation

## 2016-12-07 DIAGNOSIS — Y999 Unspecified external cause status: Secondary | ICD-10-CM | POA: Insufficient documentation

## 2016-12-07 DIAGNOSIS — R51 Headache: Secondary | ICD-10-CM | POA: Diagnosis not present

## 2016-12-07 DIAGNOSIS — E039 Hypothyroidism, unspecified: Secondary | ICD-10-CM | POA: Diagnosis not present

## 2016-12-07 DIAGNOSIS — Y9389 Activity, other specified: Secondary | ICD-10-CM | POA: Diagnosis not present

## 2016-12-07 DIAGNOSIS — Z79899 Other long term (current) drug therapy: Secondary | ICD-10-CM | POA: Insufficient documentation

## 2016-12-07 DIAGNOSIS — X58XXXA Exposure to other specified factors, initial encounter: Secondary | ICD-10-CM | POA: Insufficient documentation

## 2016-12-07 DIAGNOSIS — S0591XA Unspecified injury of right eye and orbit, initial encounter: Secondary | ICD-10-CM | POA: Diagnosis present

## 2016-12-07 DIAGNOSIS — Z87891 Personal history of nicotine dependence: Secondary | ICD-10-CM | POA: Insufficient documentation

## 2016-12-07 DIAGNOSIS — S0501XA Injury of conjunctiva and corneal abrasion without foreign body, right eye, initial encounter: Secondary | ICD-10-CM

## 2016-12-07 MED ORDER — FLUORESCEIN SODIUM 0.6 MG OP STRP
1.0000 | ORAL_STRIP | Freq: Once | OPHTHALMIC | Status: AC
  Administered 2016-12-07: 1 via OPHTHALMIC
  Filled 2016-12-07: qty 1

## 2016-12-07 MED ORDER — TETRACAINE HCL 0.5 % OP SOLN
2.0000 [drp] | Freq: Once | OPHTHALMIC | Status: AC
  Administered 2016-12-07: 2 [drp] via OPHTHALMIC
  Filled 2016-12-07: qty 4

## 2016-12-07 NOTE — ED Provider Notes (Addendum)
[facility]PT [facility] Provider Note   CSN: [id number] Arrival date & time: 12/07/16  1954   By signing my name below, I, Elaine [name], attest that this documentation has been prepared under the direction and in the presence of Carmon Sails, PA-C Electronically Signed: Soijett [name], ED Scribe. 12/07/16. 11:47 PM.  History   Chief Complaint Chief Complaint  Patient presents with  . Eye Problem    HPI Elaine [name] is a 83 y.o. female who presents to the Emergency Department complaining of right eye problem onset 4.5 hours ago. Pt reports associated right eye redness, right eye clear drainage, right eye pain, FB sensation to right eye.  Patient developed a gradual in onset "severe" HA while in ED. Pt has not tried any medications for the relief of her symptoms. She states that she was using a leaf blower when she felt a FB sensation to her right eye. Has had bilateral cataract surgery. Denies wearing contacts, but notes that she wears reading glasses. Pt denies nausea, vomiting, CP, SOB, blurred vision and any other symptoms. No h/o frequent headaches.    The history is provided by the patient. No language interpreter was used.    Past Medical History:  Diagnosis Date  . Hypothyroid   . Leukocytosis   . Osteopenia   . Peritoneal carcinoma ([place]) 2006   with lung met; at [facility]; Dr. Meriam Sprague.     Patient Active Problem List   Diagnosis Date Noted  . Peritoneal carcinoma ([place])   . Leukocytosis     Past Surgical History:  Procedure Laterality Date  . ABDOMINAL HYSTERECTOMY  2006  . COLONOSCOPY  2012   neg.   . OOPHORECTOMY  2006  . SPLENECTOMY  2006  . TONSILLECTOMY    . TOTAL ABDOMINAL HYSTERECTOMY W/ BILATERAL SALPINGOOPHORECTOMY  2006    OB History    No data available       Home Medications    Prior to Admission medications   Medication Sig Start Date End Date Taking? Authorizing Provider  alendronate (FOSAMAX) 70 MG tablet 70 mg Once a week. 03/20/12    [provider]  calcium carbonate (OS-CAL) 600 MG TABS Take 600 mg by mouth 2 (two) times daily with a meal.    [provider]  calcium carbonate (TUMS - DOSED IN MG ELEMENTAL CALCIUM) 500 MG chewable tablet Chew 1 tablet by mouth as needed for heartburn.    [provider]  cholecalciferol (VITAMIN D) 1000 UNITS tablet Take 1,000 Units by mouth daily.    [provider]  ciprofloxacin (CILOXAN) 0.3 % ophthalmic solution Place 1 drop into the right eye every 6 (six) hours. Administer 1 drop, every 2 hours, while awake, for 2 days. Then 1 drop, every 4 hours, while awake, for the next 5 days. 12/08/16 12/13/16  Kinnie Feil, PA-C  fish oil-omega-3 fatty acids 1000 MG capsule Take 4,800 mg by mouth daily.     [provider]  levothyroxine (SYNTHROID, LEVOTHROID) 50 MCG tablet 50 mcg Daily. 03/06/12   [provider]  Multiple Vitamins-Minerals (MULTIVITAMIN WITH MINERALS) tablet Take 1 tablet by mouth daily.    [provider]  niacin 250 MG tablet Take 250 mg by mouth daily with breakfast.    [provider]    Family History Family History  Problem Relation Age of Onset  . Heart failure Mother   . Cancer Father     colon  . Lung disease Father  miner  . Cancer Sister     leukemia    Social History Social History  Substance Use Topics  . Smoking status: Former Smoker    Packs/day: 0.25    Years: 5.00    Quit date: 08/05/1978  . Smokeless tobacco: Never Used  . Alcohol use No     Allergies   Aleve [naproxen sodium]; Belladonna alkaloids; Percocet [oxycodone-acetaminophen]; Anzemet [dolasetron]; Oxycontin [oxycodone hcl]; Sulfamethoxazole-trimethoprim; and Vicodin [hydrocodone-acetaminophen]   Review of Systems Review of Systems  Eyes: Positive for pain (right), discharge (right) and redness (right).       +FB sensation to right eye  Gastrointestinal: Negative for nausea and vomiting.  Neurological:  Positive for headaches.     Physical Exam Updated Vital Signs BP (!) 221/85 (BP Location: Right Arm)   Pulse 72   Temp 98.6 F (37 C) (Oral)   Resp 18   Ht _0  (1.6 m)   Wt 128 lb (58.1 kg)   SpO2 100%   BMI 22.67 kg/m   Physical Exam  Constitutional: She is oriented to person, place, and time. She appears well-developed and well-nourished. No distress.  HENT:  Head: Normocephalic and atraumatic.  No temporal tenderness  Eyes: EOM are normal. Pupils are equal, round, and reactive to light.  Slit lamp exam:      The right eye shows corneal abrasion, foreign body and fluorescein uptake.  No direct or consensual photophobia. Small foreign body under the left upper eyelid. Left eye OP is 15. Right eye OP is 16. Right fluorescein uptake covering 1/3 of iris.  Neck: Neck supple.  Cardiovascular: Normal rate, regular rhythm and intact distal pulses.   No murmur heard. Pulmonary/Chest: Effort normal and breath sounds normal. No respiratory distress.  Abdominal: Soft. She exhibits no mass. There is no tenderness.  Musculoskeletal: Normal range of motion.  Neurological: She is alert and oriented to person, place, and time.  Pt is alert and oriented.   Speech and phonation normal.   Thought process coherent.   Strength 5/5 in upper and lower extremities.   Sensation to light touch intact in upper and lower extremities.  Gait normal.   Negative Romberg. No leg drift.  Intact finger to nose test. CN I not tested CN II full visual fields  CN III, IV, VI PEERL and EOM intact CN V light touch intact in all 3 divisions of trigeminal nerve CN VII facial nerve movements intact, symmetric CN VIII hearing intact to finger rub CN IX, X no uvula deviation, symmetric soft palate rise CN XI 5/5 SCM and trapezius strength  CN XII Tongue midline with symmetric L/R movement  Skin: Skin is warm and dry.  Psychiatric: She has a normal mood and affect. Her behavior is normal.  Nursing note and  vitals reviewed.    ED Treatments / Results  DIAGNOSTIC STUDIES: Oxygen Saturation is 100% on RA, nl by my interpretation.    COORDINATION OF CARE: 11:46 PM Discussed treatment plan with pt at bedside and pt agreed to plan.   Labs (all labs ordered are listed, but only abnormal results are displayed) Labs Reviewed - No data to display  EKG  EKG Interpretation None       Radiology Ct Head Wo Contrast  Result Date: 12/08/2016 CLINICAL DATA:  RIGHT eye pain and redness after blowing leaves today. Headache. Foreign body removed in emergency department. History of hypertension, peritoneal carcinoma. EXAM: CT HEAD WITHOUT CONTRAST TECHNIQUE: Contiguous axial images were obtained from the base  of the skull through the vertex without intravenous contrast. COMPARISON:  None available for comparison at time of study interpretation. FINDINGS: BRAIN: No intraparenchymal hemorrhage, mass effect nor midline shift. The ventricles and sulci are normal for age. Patchy supratentorial white matter hypodensities less than expected for patient's age, though non-specific are most compatible with chronic small vessel ischemic disease. No acute large vascular territory infarcts. No abnormal extra-axial fluid collections. Basal cisterns are patent. VASCULAR: Mild calcific atherosclerosis of the carotid siphons. SKULL: No skull fracture. Severe temporomandibular osteoarthrosis. No significant scalp soft tissue swelling. SINUSES/ORBITS: The mastoid air-cells and included paranasal sinuses are well-aerated.Tiny bubbles of RIGHT subpalpebral gas, no radiopaque foreign bodies. OTHER: None. IMPRESSION: Small amount of RIGHT subpalpebral gas associated with lavage without radiopaque foreign bodies. Negative noncontrast CT HEAD for age. Electronically Signed   By: Elon Alas M.D.   On: 12/08/2016 01:52    Procedures .Foreign Body Removal Date/Time: 12/18/2016 4:04 PM Performed by: Kinnie Feil Authorized  by: Kinnie Feil  Consent: Verbal consent obtained. Risks and benefits: risks, benefits and alternatives were discussed Consent given by: patient Patient understanding: patient states understanding of the procedure being performed Patient consent: the patient's understanding of the procedure matches consent given Procedure consent: procedure consent matches procedure scheduled Patient identity confirmed: verbally with patient Time out: Immediately prior to procedure a "time out" was called to verify the correct patient, procedure, equipment, support staff and site/side marked as required. Body area: eye Location details: right eyelid  Anesthesia: Local Anesthetic: tetracaine drops  Sedation: Patient sedated: no Patient restrained: no Patient cooperative: yes Localization method: eyelid eversion Removal mechanism: moist cotton swab Eye examined with fluorescein. Fluorescein uptake. Corneal abrasion size: medium Corneal abrasion location: central No residual rust ring present. Dressing: antibiotic drops Depth: superficial Complexity: simple 1 objects recovered. Objects recovered: black small foreign body  Post-procedure assessment: foreign body removed Patient tolerance: Patient tolerated the procedure well with no immediate complications   (including critical care time)  Medications Ordered in ED Medications  tetracaine (PONTOCAINE) 0.5 % ophthalmic solution 2 drop (2 drops Right Eye Given 12/07/16 2350)  fluorescein ophthalmic strip 1 strip (1 strip Right Eye Given 12/07/16 2350)  acetaminophen (TYLENOL) tablet 1,000 mg (1,000 mg Oral Given 12/08/16 0021)     Initial Impression / Assessment and Plan / ED Course  I have reviewed the triage vital signs and the nursing notes.  Pertinent labs & imaging results that were available during my care of the patient were reviewed by me and considered in my medical decision making (see chart for details).   83 yo female with  corneal abrasion cover 1/3 of iris.  FB removed from inner upper eyelid.  PERRL, EOMs, IOP normal.  Will discharge with antibiotic drops and close ophtho appointment in 24-48 hours for abrasion recheck.    Patient developed "bad" headache while in ED, she has no h/o frequent headaches.  Blood pressure found to be elevated and fluctuating between 384-536 systolic, she has no h/o HTN.  Other than headache, asymptomatic.  Neuro exam normal.  Highly suspicion blood pressure elevated due to headache and eye pain, also patient has not had anything to eat today. Given new onset headache and elevated BP without h/o HTN or headaches, and age, I recommended CT scan.    CT scan normal.  Headache resolved with tylenol.  BP much improved once headache resolved, now at 468-032 systolic.  Patient considered safe for discharge now with strict ED return precautions, abx  eye drops, ophtho f/u in 24-48 hours.  Advised to monitor BP and contact PCP if it remains elevated. Patient verbalized understanding, she is eager to go home.    Final Clinical Impressions(s) / ED Diagnoses   Final diagnoses:  Foreign body of right eye, initial encounter  Abrasion of right cornea, initial encounter    New Prescriptions Discharge Medication List as of 12/08/2016  2:04 AM    START taking these medications   Details  ciprofloxacin (CILOXAN) 0.3 % ophthalmic solution Place 1 drop into the right eye every 6 (six) hours. Administer 1 drop, every 2 hours, while awake, for 2 days. Then 1 drop, every 4 hours, while awake, for the next 5 days., Starting Sun 12/08/2016, Until Fri 12/13/2016, Print       I personally performed the services described in this documentation, which was scribed in my presence. The recorded information has been reviewed and is accurate.     Kinnie Feil, PA-C 12/09/16 1203    Virgel Manifold, MD 12/09/16 1216    Kinnie Feil, PA-C 12/18/16 1605    Virgel Manifold, MD 12/18/16 2017

## 2016-12-07 NOTE — ED Triage Notes (Signed)
Pt reports she was blowing leaves today and she feels like something got into eye. Redness, drainage and pain to the right eye.

## 2016-12-08 ENCOUNTER — Emergency Department (HOSPITAL_BASED_OUTPATIENT_CLINIC_OR_DEPARTMENT_OTHER): Payer: Medicare Other

## 2016-12-08 IMAGING — CT CT HEAD W/O CM
3 series · 15 of 47 positions shown, 18 images · non-contrast
Comparison: None available for comparison at time of study
interpretation.

CLINICAL DATA: RIGHT eye pain and redness after blowing leaves
today. Headache. Foreign body removed in emergency department.
History of hypertension, peritoneal carcinoma.

EXAM:
CT HEAD WITHOUT CONTRAST
TECHNIQUE: Contiguous axial images were obtained from the base of the skull
through the vertex without intravenous contrast.

[Series 2: head wo · axial · 0.41mm/px · z∈[+989,+1114]mm · 9 of 30 slices shown, 12 images]
[im 3/30  brain]
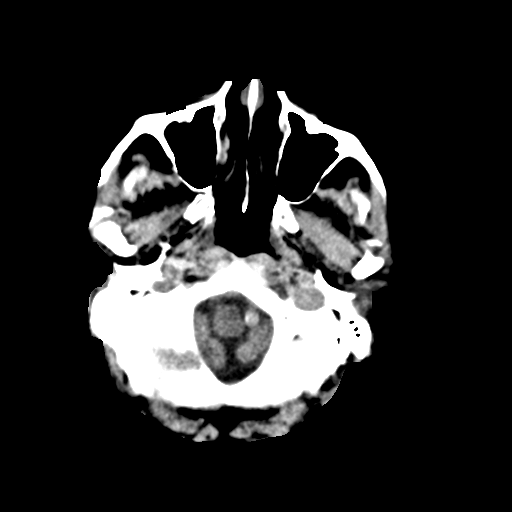
[im 3/30  bone]
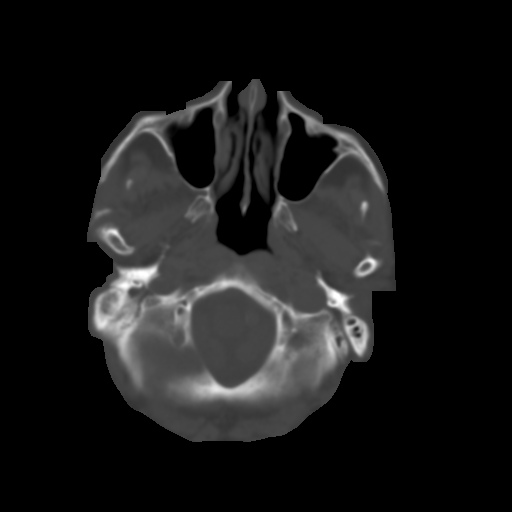
[im 6/30  brain]
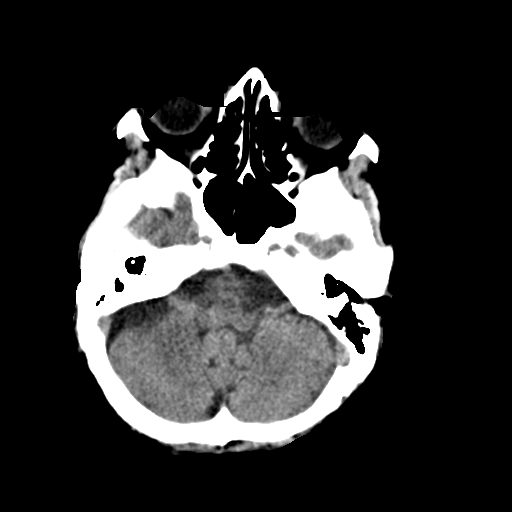
[im 9/30  brain]
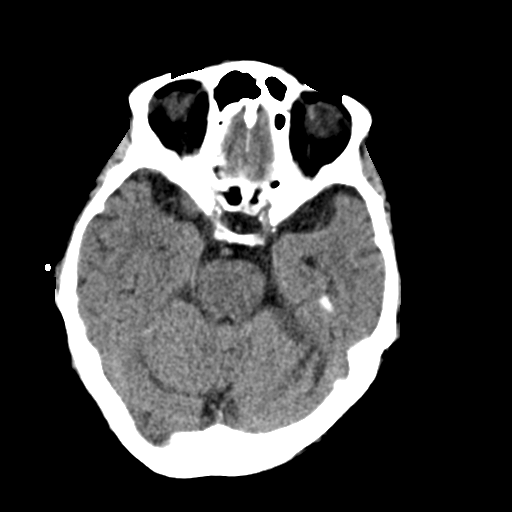
[im 12/30  brain]
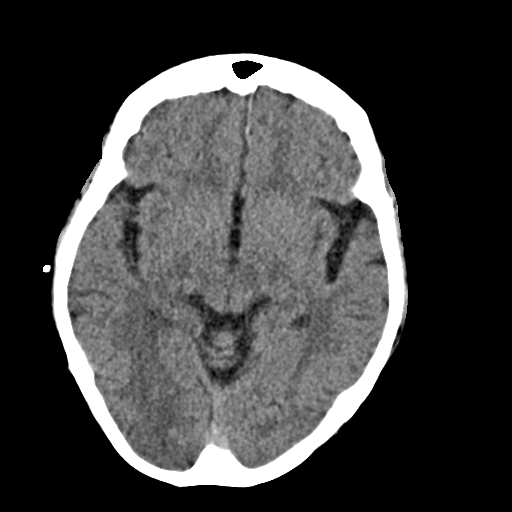
[im 16/30  brain]
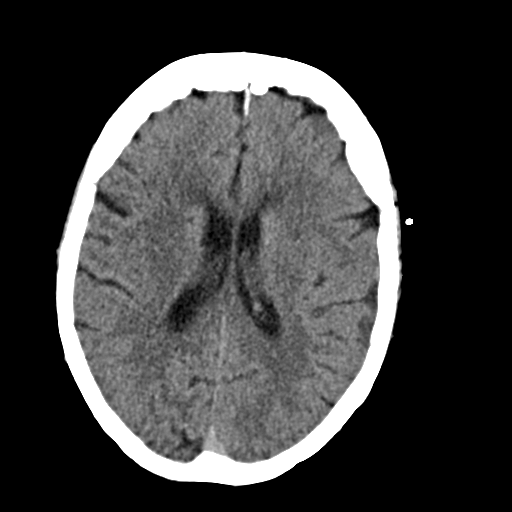
[im 16/30  bone]
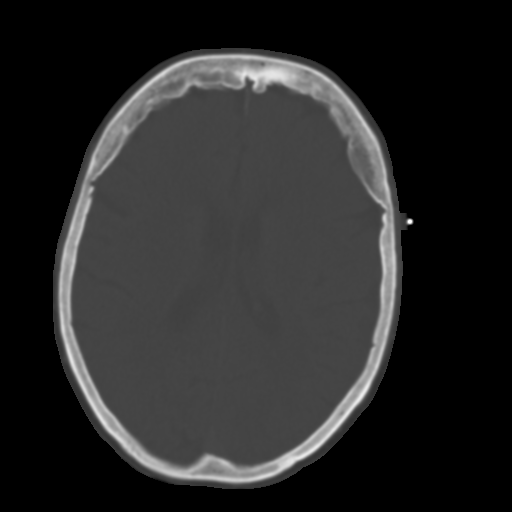
[im 19/30  brain]
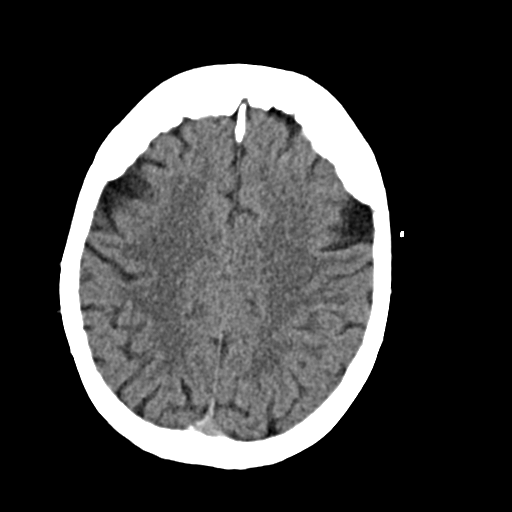
[im 22/30  brain]
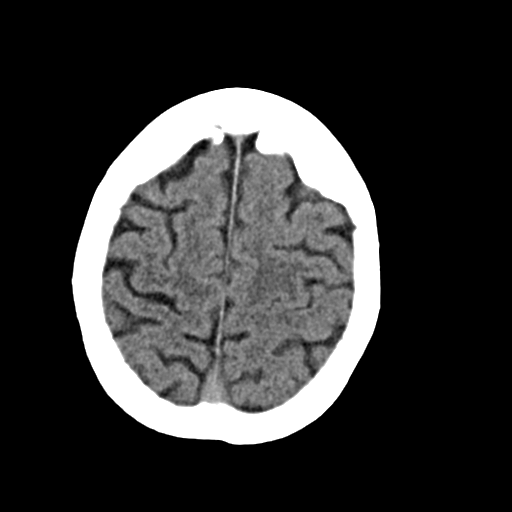
[im 25/30  brain]
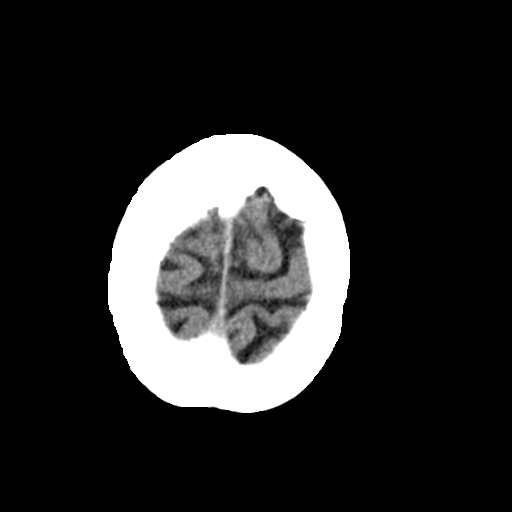
[im 28/30  brain]
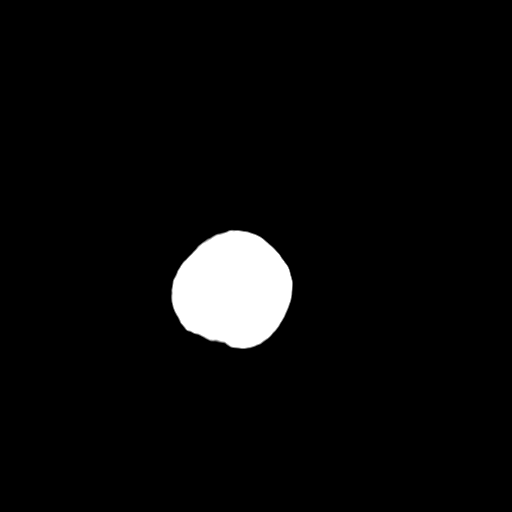
[im 28/30  bone]
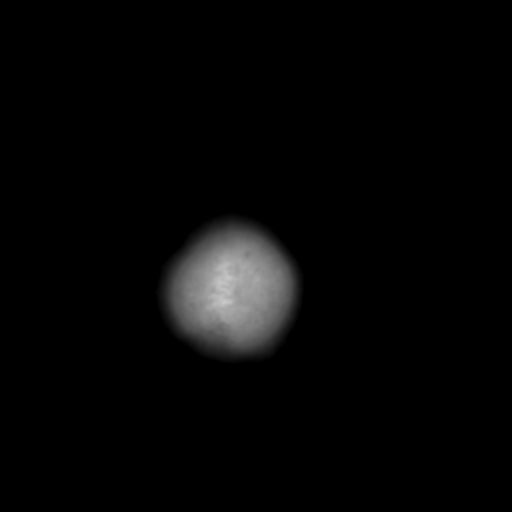

[Series 4: coronal soft · coronal · 0.30mm/px · 3 of 67 slices shown]
[im 23/67  brain]
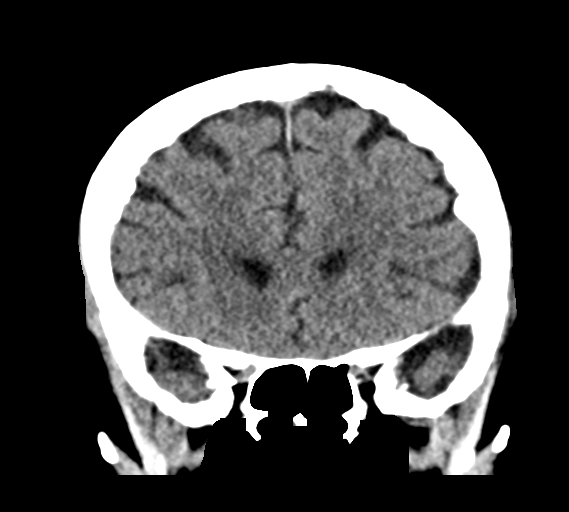
[im 30/67  brain]
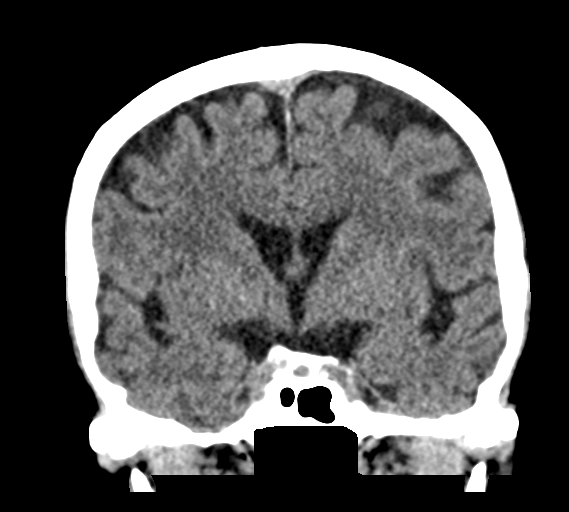
[im 37/67  brain]
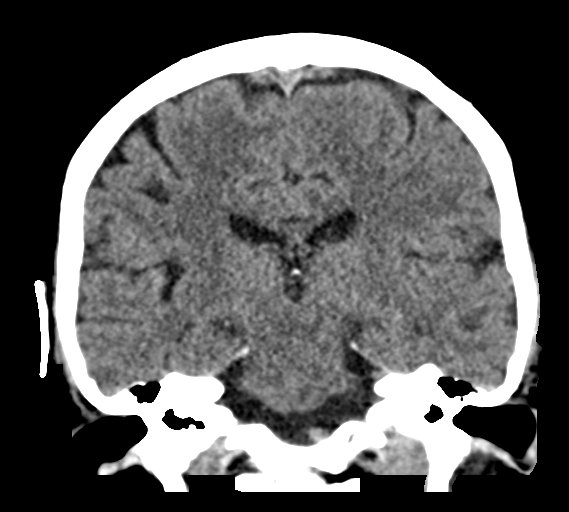

[Series 5: sag soft · sagittal · 0.29mm/px · 3 of 60 slices shown]
[im 20/60  brain]
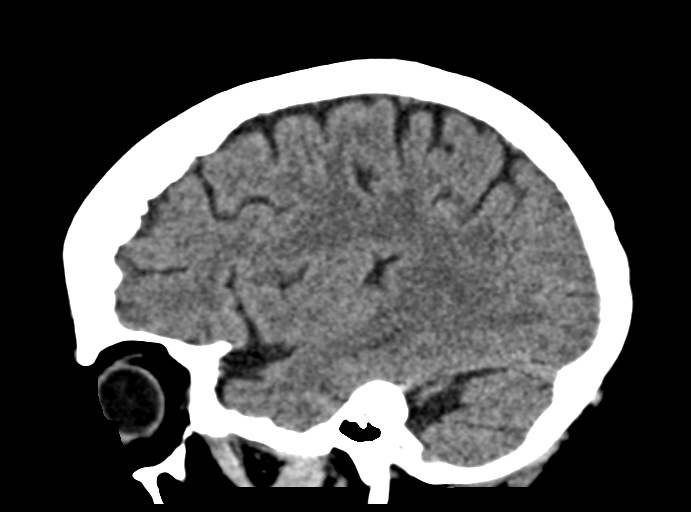
[im 30/60  brain]
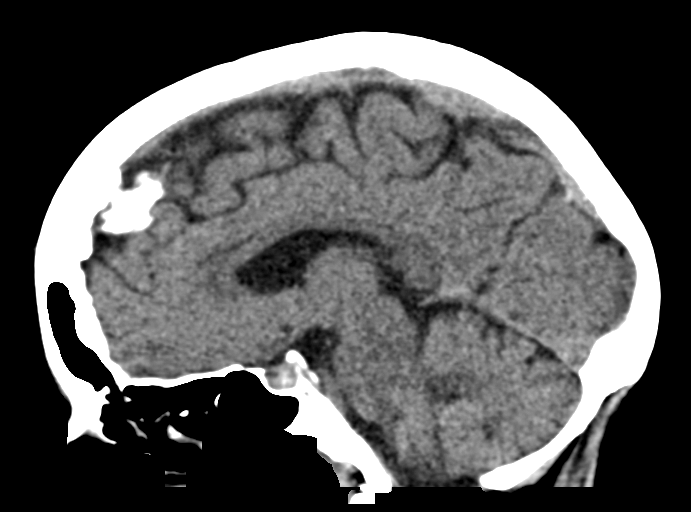
[im 40/60  brain]
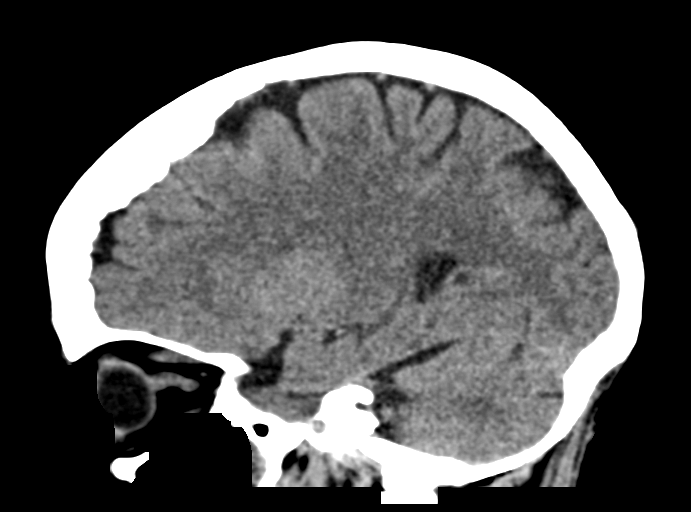

[15 of 47 positions shown; findings below may reference images not displayed]

FINDINGS: BRAIN: No intraparenchymal hemorrhage, mass effect nor midline
shift. The ventricles and sulci are normal for age. Patchy
supratentorial white matter hypodensities less than expected for
patient's age, though non-specific are most compatible with chronic
small vessel ischemic disease. No acute large vascular territory
infarcts. No abnormal extra-axial fluid collections. Basal cisterns
are patent.

VASCULAR: Mild calcific atherosclerosis of the carotid siphons.

SKULL: No skull fracture. Severe temporomandibular osteoarthrosis.
No significant scalp soft tissue swelling.

SINUSES/ORBITS: The mastoid air-cells and included paranasal sinuses
are well-aerated.Tiny bubbles of RIGHT subpalpebral gas, no
radiopaque foreign bodies.

OTHER: None.
IMPRESSION: Small amount of RIGHT subpalpebral gas associated with lavage
without radiopaque foreign bodies.

Negative noncontrast CT HEAD for age.

## 2016-12-08 MED ORDER — CIPROFLOXACIN HCL 0.3 % OP SOLN: 1.0000 [drp] | Freq: Four times a day (QID) | OPHTHALMIC | 0 refills | Status: AC

## 2016-12-08 MED ORDER — ACETAMINOPHEN 500 MG PO TABS
1000.0000 mg | ORAL_TABLET | Freq: Once | ORAL | Status: AC
  Administered 2016-12-08: 1000 mg via ORAL
  Filled 2016-12-08: qty 2

## 2016-12-08 NOTE — ED Notes (Signed)
Returned from CT scan.

## 2016-12-08 NOTE — ED Notes (Signed)
Pt states her H/A is gone and she feels much better since eating and getting warm blankets.

## 2016-12-08 NOTE — ED Notes (Signed)
Pt to CT

## 2016-12-08 NOTE — Discharge Instructions (Addendum)
On exam today I found a small, black, circular foreign body under your upper eyelid.  This was removed.  I also found a corneal abrasion due to this foreign body.  Your corneal abrasion covers 1/4 to 1/3 of your iris (colored part of your eye).    You have been prescribed an antibiotic eye drop, please use these as prescribed: Ciprofloxacin 0.3% opthalmic solution (antibiotic)  Unfortunately I am unable to give you anti-inflammatory drops as you are allergic to aleve.  Please use over the counter rewetting eye drops to help with eye dryness and provide comfort.   Please avoid touching or rubbing your eye. You may wear sun glasses for light sensitivity.  Take tylenol 1000 mg every 8 hours for associated headache or eye pain.   Contact your eye doctor and schedule an appointment for a "corneal abrasion check" on Monday or Tuesday.  I would like your abrasion to be re-evaluation to ensure that it is healing appropriately.   Return to the emergency department if you develop yellow/white discharge, bloody discharge, pain with eye movements, eye lid or facial swelling or changes in vision.   Your blood pressure was elevated today.  This could have been from hunger, stress and/or eye pain.  You did not have any chest pain, nausea, vomiting, difficulty breathing or other symptoms. Because you had a headache with your elevated blood pressure we obtained a CT scan of your head, which was NEGATIVE for any abnormalities.  Please get your blood pressure re-checked tonight and tomorrow at pharmacy or other grocery store.  Write these readings down so you can share them with your primary care doctor.  Return to the emergency department if you have an elevated blood pressure reading (greater than 150/80) AND headache, nausea, vomiting, chest pain, or difficulty breathing.   Contact your primary care doctor on Monday for further discussion of your recent blood pressure readings, he will check your blood pressure  again.  Keep track of your blood pressure for the next few days until you see your doctor so he can see the blood pressure trend.  He may suggest starting a blood pressure medication.

## 2016-12-08 NOTE — ED Notes (Signed)
Pt given snacks and liquids. Pt states she will stay another hour to revaluate headache and high blood pressure.

## 2017-02-28 ENCOUNTER — Encounter: Payer: Self-pay | Admitting: *Deleted

## 2017-03-03 ENCOUNTER — Ambulatory Visit (INDEPENDENT_AMBULATORY_CARE_PROVIDER_SITE_OTHER): Payer: Medicare Other | Admitting: Diagnostic Neuroimaging

## 2017-03-03 ENCOUNTER — Encounter: Payer: Self-pay | Admitting: Diagnostic Neuroimaging

## 2017-03-03 VITALS — BP 142/80 | HR 68 | Ht 63.5 in | Wt 128.8 lb

## 2017-03-03 DIAGNOSIS — R2689 Other abnormalities of gait and mobility: Secondary | ICD-10-CM | POA: Diagnosis not present

## 2017-03-03 DIAGNOSIS — H539 Unspecified visual disturbance: Secondary | ICD-10-CM | POA: Diagnosis not present

## 2017-03-03 NOTE — Progress Notes (Signed)
[facility]  PATIENT: Elaine [name] DOB: 1931-09-02  REFERRING CLINICIAN: Audie Pinto HISTORY FROM: patient  REASON FOR VISIT: new consult    HISTORICAL  CHIEF COMPLAINT:  Chief Complaint  Patient presents with  . NP [place]  . gait abnormality    One episode of walking to left, and double vision.(may 2018).  This was when traveling by car. Has not happened since.    . Diplopia    HISTORY OF PRESENT ILLNESS:   83 year old female here for evaluation of transient gait difficulty and transient double vision. May 2018 patient was out of state visiting family, in kitchen, when she turned and started to fall to the left side. Her family member caught her and patient was able to sit down in the chair. Patient had no numbness or weakness. Patient turned seem to trigger her fall. No lightheadedness or dizziness.  The next day 1 patient was driving back home on the highway she had less than 1 minute transient double vision. No other associated symptoms.  Since that time no further events. She had some mild blurred vision one day but not similar to previous double vision.  Patient otherwise has no significant complaints. Patient takes vitamins, calcium supplement, thyroid medication. Patient feels well at this time.   REVIEW OF SYSTEMS: Full 14 system review of systems performed and negative with exception of: Blurred vision spinning sensation. History of peritoneal cancer.  ALLERGIES: Allergies  Allergen Reactions  . Aleve [Naproxen Sodium] Swelling  . Belladonna Alkaloids Other (See Comments)    Facial edema  . Ciprofloxacin Other (See Comments)    Cannot have high doses  . Other Other (See Comments)  . Oxycodone-Acetaminophen Nausea And Vomiting  . Percocet [Oxycodone-Acetaminophen] Nausea And Vomiting  . Anzemet [Dolasetron] Rash  . Oxycodone Rash  . Oxycontin [Oxycodone Hcl] Rash  . Sulfamethoxazole-Trimethoprim Rash  . Vicodin  [Hydrocodone-Acetaminophen] Rash    HOME MEDICATIONS: Outpatient Medications Prior to Visit  Medication Sig Dispense Refill  . calcium carbonate (OS-CAL) 600 MG TABS Take 600 mg by mouth 2 (two) times daily with a meal.    . calcium carbonate (TUMS - DOSED IN MG ELEMENTAL CALCIUM) 500 MG chewable tablet Chew 1 tablet by mouth as needed for heartburn.    . cholecalciferol (VITAMIN D) 1000 UNITS tablet Take 1,000 Units by mouth daily.    . fish oil-omega-3 fatty acids 1000 MG capsule Take 4,800 mg by mouth daily.     Marland Kitchen levothyroxine (SYNTHROID, LEVOTHROID) 50 MCG tablet 50 mcg Daily.    . Multiple Vitamins-Minerals (MULTIVITAMIN WITH MINERALS) tablet Take 1 tablet by mouth daily.    Marland Kitchen alendronate (FOSAMAX) 70 MG tablet 70 mg Once a week.    . niacin 250 MG tablet Take 250 mg by mouth daily with breakfast.     No facility-administered medications prior to visit.     PAST MEDICAL HISTORY: Past Medical History:  Diagnosis Date  . Acid reflux   . Hypothyroid   . Leukocytosis   . Neuropathy    secondary to chemo x 2  . Osteopenia   . Peritoneal carcinoma ([place]) 2006   with lung met; at [facility]; Dr. Meriam Sprague.     PAST SURGICAL HISTORY: Past Surgical History:  Procedure Laterality Date  . ABDOMINAL HYSTERECTOMY  2006  . COLONOSCOPY  2012   neg.   . OOPHORECTOMY  2006  . SPLENECTOMY  2006  . TONSILLECTOMY    . TOTAL ABDOMINAL HYSTERECTOMY W/ BILATERAL SALPINGOOPHORECTOMY  2006  FAMILY HISTORY: Family History  Problem Relation Age of Onset  . Heart failure Mother        congestive HF  . Heart attack Mother   . Cancer Father        colon  . Lung disease Father        miner  . Cancer Sister        leukemia, colon cancer  . Lung disease Brother        black lung.     SOCIAL HISTORY:  Social History   Social History  . Marital status: Divorced    Spouse name: N/A  . Number of children: 3  . Years of education: N/A   Occupational History  .      retired  Photographer   Social History Main Topics  . Smoking status: Former Smoker    Packs/day: 0.25    Years: 5.00    Quit date: 08/05/1978  . Smokeless tobacco: Never Used  . Alcohol use Yes     Comment: occ  . Drug use: No  . Sexual activity: Not on file   Other Topics Concern  . Not on file   Social History Narrative   Lives at home alone.  Caffeine 2 cups daily.  Retired.  3 children.       PHYSICAL EXAM  GENERAL EXAM/CONSTITUTIONAL: Vitals:  Vitals:   03/03/17 1044  BP: (!) 142/80  Pulse: 68  Weight: 128 lb 12.8 oz (58.4 kg)  Height: 5' 3.5" (1.613 m)    Body mass index is 22.46 kg/m.  Visual Acuity Screening   Right eye Left eye Both eyes  Without correction: 20/30 20/30   With correction:       Patient is in no distress; well developed, nourished and groomed; neck is supple  CARDIOVASCULAR:  Examination of carotid arteries is normal; no carotid bruits  Regular rate and rhythm, no murmurs  Examination of peripheral vascular system by observation and palpation is normal  EYES:  Ophthalmoscopic exam of optic discs and posterior segments is normal; no papilledema or hemorrhages  MUSCULOSKELETAL:  Gait, strength, tone, movements noted in Neurologic exam below  NEUROLOGIC: MENTAL STATUS:  No flowsheet data found.  awake, alert, oriented to person, place and time  recent and remote memory intact  normal attention and concentration  language fluent, comprehension intact, naming intact,   fund of knowledge appropriate  CRANIAL NERVE:   2nd - no papilledema on fundoscopic exam  2nd, 3rd, 4th, 6th - pupils equal and reactive to light, visual fields full to confrontation, extraocular muscles intact, no nystagmus  5th - facial sensation symmetric  7th - facial strength symmetric  8th - hearing intact  9th - palate elevates symmetrically, uvula midline  11th - shoulder shrug symmetric  12th - tongue protrusion midline  MOTOR:    normal bulk and tone, full strength in the BUE, BLE  SENSORY:   normal and symmetric to light touch, temperature, vibration  COORDINATION:   finger-nose-finger, fine finger movements normal  REFLEXES:   deep tendon reflexes present and symmetric  GAIT/STATION:   narrow based gait    DIAGNOSTIC DATA (LABS, IMAGING, TESTING) - I reviewed patient records, labs, notes, testing and imaging myself where available.  Lab Results  Component Value Date   WBC 12.8 (H) 01/03/2014   HGB 12.0 01/03/2014   HCT 36.7 01/03/2014   MCV 94.9 01/03/2014   PLT 409 (H) 01/03/2014      Component Value Date/Time  NA 139 04/26/2013 1042   K 4.5 04/26/2013 1042   CL 104 05/04/2012 1010   CO2 27 04/26/2013 1042   GLUCOSE 96 04/26/2013 1042   GLUCOSE 98 05/04/2012 1010   BUN 12.7 04/26/2013 1042   CREATININE 0.8 04/26/2013 1042   CALCIUM 9.8 04/26/2013 1042   PROT 7.0 04/26/2013 1042   ALBUMIN 3.5 04/26/2013 1042   AST 16 04/26/2013 1042   ALT 12 04/26/2013 1042   ALKPHOS 45 04/26/2013 1042   BILITOT 0.33 04/26/2013 1042   No results found for: CHOL, HDL, LDLCALC, LDLDIRECT, TRIG, CHOLHDL No results found for: HGBA1C No results found for: VITAMINB12 No results found for: TSH   CT head  - negative    ASSESSMENT AND PLAN  83 y.o. year old female here with transient gait difficulty and separate transient double vision.   Ddx transient balance difficulty + transient double vision: TIA vs fatigue vs metabolic, vs neuromuscular junction dz  1. Transient vision disturbance   2. Balance problem     PLAN:  - I offered to check additional testing with MRI brain, MRA head/neck --> however patient feels well and would like to hold off on testing for now  - continue current medications  - follow up BP at home  Return if symptoms worsen or fail to improve, for return to PCP.    Penni Bombard, MD 0/07/2240, 14:64 AM Certified in Neurology, Neurophysiology and  Neuroimaging  [facility] [address] [place], [place] [address] [phone]

## 2017-03-03 NOTE — Patient Instructions (Signed)
Thank you for coming to see [place] at [facility]. I hope we have been able to provide you high quality care today.  You may receive a patient satisfaction survey over the next few weeks. We would appreciate your feedback and comments so that we may continue to improve ourselves and the health of our patients.  - monitor BP and symptoms at home   ~~~~~~~~~~~~~~~~~~~~~~~~~~~~~~~~~~~~~~~~~~~~~~~~~~~~~~~~~~~~~~~~~  DR. PENUMALLI'S GUIDE TO HAPPY AND HEALTHY LIVING These are some of my general health and wellness recommendations. Some of them may apply to you better than others. Please use common sense as you try these suggestions and feel free to ask me any questions.   ACTIVITY/FITNESS Mental, social, emotional and physical stimulation are very important for brain and body health. Try learning a new activity (arts, music, language, sports, games).  Keep moving your body to the best of your abilities. You can do this at home, inside or outside, the park, community center, gym or anywhere you like. Consider a physical therapist or personal trainer to get started. Consider the app Sworkit. Fitness trackers such as smart-watches, smart-phones or Fitbits can help as well.   NUTRITION Eat more plants: colorful vegetables, nuts, seeds and berries.  Eat less sugar, salt, preservatives and processed foods.  Avoid toxins such as cigarettes and alcohol.  Drink water when you are thirsty. Warm water with a slice of lemon is an excellent morning drink to start the day.  Consider these websites for more information The Nutrition Source (https://www.henry-hernandez.biz/) Precision Nutrition (WindowBlog.ch)   RELAXATION Consider practicing mindfulness meditation or other relaxation techniques such as deep breathing, prayer, yoga, tai chi, massage. See website mindful.org or the apps Headspace or Calm to help get started.   SLEEP Try to get  at least 7-8+ hours sleep per day. Regular exercise and reduced caffeine will help you sleep better. Practice good sleep hygeine techniques. See website sleep.org for more information.   PLANNING Prepare estate planning, living will, healthcare POA documents. Sometimes this is best planned with the help of an attorney. Theconversationproject.org and agingwithdignity.org are excellent resources.

## 2017-03-26 ENCOUNTER — Other Ambulatory Visit: Payer: Self-pay | Admitting: Internal Medicine

## 2017-03-26 DIAGNOSIS — Z1231 Encounter for screening mammogram for malignant neoplasm of breast: Secondary | ICD-10-CM

## 2017-05-05 ENCOUNTER — Ambulatory Visit
Admission: RE | Admit: 2017-05-05 | Discharge: 2017-05-05 | Disposition: A | Discharge status: Home / Self Care | Payer: Medicare Other | Source: Ambulatory Visit | Attending: Internal Medicine | Admitting: Internal Medicine

## 2017-05-05 DIAGNOSIS — Z1231 Encounter for screening mammogram for malignant neoplasm of breast: Secondary | ICD-10-CM

## 2017-10-22 DIAGNOSIS — E039 Hypothyroidism, unspecified: Secondary | ICD-10-CM | POA: Diagnosis not present

## 2017-10-22 DIAGNOSIS — M810 Age-related osteoporosis without current pathological fracture: Secondary | ICD-10-CM | POA: Diagnosis not present

## 2018-01-28 DIAGNOSIS — E559 Vitamin D deficiency, unspecified: Secondary | ICD-10-CM | POA: Diagnosis not present

## 2018-01-28 DIAGNOSIS — M810 Age-related osteoporosis without current pathological fracture: Secondary | ICD-10-CM | POA: Diagnosis not present

## 2018-01-28 DIAGNOSIS — E7800 Pure hypercholesterolemia, unspecified: Secondary | ICD-10-CM | POA: Diagnosis not present

## 2018-01-28 DIAGNOSIS — E039 Hypothyroidism, unspecified: Secondary | ICD-10-CM | POA: Diagnosis not present

## 2018-02-18 DIAGNOSIS — D72829 Elevated white blood cell count, unspecified: Secondary | ICD-10-CM | POA: Diagnosis not present

## 2018-02-18 DIAGNOSIS — Z0000 Encounter for general adult medical examination without abnormal findings: Secondary | ICD-10-CM | POA: Diagnosis not present

## 2018-02-18 DIAGNOSIS — M810 Age-related osteoporosis without current pathological fracture: Secondary | ICD-10-CM | POA: Diagnosis not present

## 2018-02-18 DIAGNOSIS — E559 Vitamin D deficiency, unspecified: Secondary | ICD-10-CM | POA: Diagnosis not present

## 2018-02-18 DIAGNOSIS — Z01419 Encounter for gynecological examination (general) (routine) without abnormal findings: Secondary | ICD-10-CM | POA: Diagnosis not present

## 2018-02-18 DIAGNOSIS — E039 Hypothyroidism, unspecified: Secondary | ICD-10-CM | POA: Diagnosis not present

## 2018-02-27 ENCOUNTER — Encounter: Payer: Self-pay | Admitting: Nurse Practitioner

## 2018-02-27 ENCOUNTER — Telehealth: Payer: Self-pay | Admitting: Nurse Practitioner

## 2018-02-27 NOTE — Telephone Encounter (Signed)
New hem referral from Dr. Shelia Media at [facility] for leukocytosis. Pt has been scheduled to see Cira Rue on 8/7 at 130pm. Letter mailed to the pt.

## 2018-03-11 ENCOUNTER — Telehealth: Payer: Self-pay | Admitting: Hematology

## 2018-03-11 ENCOUNTER — Inpatient Hospital Stay: Payer: Medicare HMO

## 2018-03-11 ENCOUNTER — Inpatient Hospital Stay: Payer: Medicare HMO | Attending: Nurse Practitioner | Admitting: Nurse Practitioner

## 2018-03-11 ENCOUNTER — Encounter: Payer: Self-pay | Admitting: Nurse Practitioner

## 2018-03-11 VITALS — BP 146/75 | HR 67 | Temp 98.3°F | Resp 17 | Ht 63.5 in | Wt 135.5 lb

## 2018-03-11 DIAGNOSIS — Z8589 Personal history of malignant neoplasm of other organs and systems: Secondary | ICD-10-CM | POA: Insufficient documentation

## 2018-03-11 DIAGNOSIS — Z8041 Family history of malignant neoplasm of ovary: Secondary | ICD-10-CM | POA: Insufficient documentation

## 2018-03-11 DIAGNOSIS — Z800 Family history of malignant neoplasm of digestive organs: Secondary | ICD-10-CM | POA: Diagnosis not present

## 2018-03-11 DIAGNOSIS — D72829 Elevated white blood cell count, unspecified: Secondary | ICD-10-CM

## 2018-03-11 DIAGNOSIS — Z9081 Acquired absence of spleen: Secondary | ICD-10-CM | POA: Diagnosis not present

## 2018-03-11 DIAGNOSIS — D72820 Lymphocytosis (symptomatic): Secondary | ICD-10-CM | POA: Diagnosis not present

## 2018-03-11 DIAGNOSIS — Z806 Family history of leukemia: Secondary | ICD-10-CM | POA: Diagnosis not present

## 2018-03-11 LAB — IRON AND TIBC
IRON: 66 ug/dL (ref 41–142)
Saturation Ratios: 22 % (ref 21–57)
TIBC: 296 ug/dL (ref 236–444)
UIBC: 230 ug/dL

## 2018-03-11 LAB — CBC WITH DIFFERENTIAL (CANCER CENTER ONLY)
BASOS PCT: 0 %
Basophils Absolute: 0.1 10*3/uL (ref 0.0–0.1)
Eosinophils Absolute: 0.2 10*3/uL (ref 0.0–0.5)
Eosinophils Relative: 2 %
HEMATOCRIT: 36.8 % (ref 34.8–46.6)
HEMOGLOBIN: 12.2 g/dL (ref 11.6–15.9)
Lymphocytes Relative: 44 %
Lymphs Abs: 5.8 10*3/uL — ABNORMAL HIGH (ref 0.9–3.3)
MCH: 31.4 pg (ref 25.1–34.0)
MCHC: 33.2 g/dL (ref 31.5–36.0)
MCV: 94.8 fL (ref 79.5–101.0)
Monocytes Absolute: 0.7 10*3/uL (ref 0.1–0.9)
Monocytes Relative: 6 %
NEUTROS ABS: 6.3 10*3/uL (ref 1.5–6.5)
NEUTROS PCT: 48 %
Platelet Count: 374 10*3/uL (ref 145–400)
RBC: 3.88 MIL/uL (ref 3.70–5.45)
RDW: 14.3 % (ref 11.2–14.5)
WBC Count: 13 10*3/uL — ABNORMAL HIGH (ref 3.9–10.3)

## 2018-03-11 LAB — FERRITIN: Ferritin: 120 ng/mL (ref 11–307)

## 2018-03-11 NOTE — Progress Notes (Addendum)
Elaine [name]  [phone] Fax:([phone]) [place] consult Note   Patient Care Team: Deland Pretty, MD as PCP - General (Internal Medicine) Nobie Putnam, MD (Hematology and Oncology) Fay Records, MD as Referring Physician (Obstetrics and Gynecology) Jodi Marble, MD (Otolaryngology) 03/11/2018  CHIEF COMPLAINTS/PURPOSE OF CONSULTATION:  Seen at the request of PCP Dr. Shelia Media for leukocytosis   HISTORY OF PRESENTING ILLNESS:  Elaine [name] 84 y.o. female is here because of leukocytosis. Consult was requested by PCP Dr. Shelia Media. She was previously referred to our clinic and seen by Dr. Lamonte Sakai in 2013 for the same reason. 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  She completed adjuvant chemotherapy Taxol/carboplatin x6 cycles on 08/2005. She had recurrent disease with pulmonary metastasis subsequently treated with salvage taxotere and carboplatin with complete response on 07/2008. She was last seen by Dr. Rhodia Albright in Gyn/onc at [facility] on 04/2016 with NED.   She was found to have abnormal CBC from 2013, WBC elevated to 11.2, lymph 4.6, and thrombocytosis with plt count 449K. JAK2 mutation was negative. Over the years her WBC has remained stable, slight fluctuation up to 14.4 with lymphocytosis up to 6.0 in 2014. CBC on 02/18/18 shows WBC 15.4 and plt 408K; normal differential. She was last seen here in 2014, has been followed by PCP Dr. Shelia Media.   She has no significant comorbidities. She is retired, independent of all ADLs. No children. She exercises and works in her yard. She is up to date on age-appropriate cancer screenings. She is on calcium-vitamin D for osteopenia. Family history is significant for colon cancer in her father, and leukemia and ovarian cancer in her sister. Patient has 3 children who are alive. She is a former smoker who quit in 1980; no other  drug or alcohol use.   Today she feels very well. Denies fever, night sweats, or weight loss. Denies vaginal or bleeding otherwise. Denies cough, chest pain, or dyspnea. She has positive mood. Denies pain.    MEDICAL HISTORY:  Past Medical History:  Diagnosis Date  . Acid reflux   . Hypothyroid   . Leukocytosis   . Neuropathy    secondary to chemo x 2  . Osteopenia   . Peritoneal carcinoma ([place]) 2006   with lung met; at [facility]; Dr. Meriam Sprague.     SURGICAL HISTORY: Past Surgical History:  Procedure Laterality Date  . ABDOMINAL HYSTERECTOMY  2006  . COLONOSCOPY  2012   neg.   . OOPHORECTOMY  2006  . SPLENECTOMY  2006  . TONSILLECTOMY    . TOTAL ABDOMINAL HYSTERECTOMY W/ BILATERAL SALPINGOOPHORECTOMY  2006    SOCIAL HISTORY: Social History   Socioeconomic History  . Marital status: Divorced    Spouse name: Not on file  . Number of children: 3  . Years of education: Not on file  . Highest education level: Not on file  Occupational History    Comment: retired Photographer  Social Needs  . Financial resource strain: Not on file  . Food insecurity:    Worry: Not on file    Inability: Not on file  . Transportation needs:    Medical: Not on file    Non-medical: Not on file  Tobacco Use  . Smoking status: Former Smoker    Packs/day: 0.25    Years: 5.00    Pack years: 1.25    Last attempt to quit: 08/05/1978  Years since quitting: 39.6  . Smokeless tobacco: Never Used  Substance and Sexual Activity  . Alcohol use: Not Currently    Comment: occ  . Drug use: No  . Sexual activity: Not on file  Lifestyle  . Physical activity:    Days per week: Not on file    Minutes per session: Not on file  . Stress: Not on file  Relationships  . Social connections:    Talks on phone: Not on file    Gets together: Not on file    Attends religious service: Not on file    Active member of club or organization: Not on file    Attends meetings of clubs or organizations:  Not on file    Relationship status: Not on file  . Intimate partner violence:    Fear of current or ex partner: Not on file    Emotionally abused: Not on file    Physically abused: Not on file    Forced sexual activity: Not on file  Other Topics Concern  . Not on file  Social History Narrative   Lives at home alone.  Caffeine 2 cups daily.  Retired.  3 children.      FAMILY HISTORY: Family History  Problem Relation Age of Onset  . Heart failure Mother        congestive HF  . Heart attack Mother   . Cancer Father        colon  . Lung disease Father        miner  . Cancer Sister        leukemia and ovarian cancer  . Lung disease Brother        black lung.     ALLERGIES:  is allergic to aleve [naproxen sodium]; belladonna alkaloids; ciprofloxacin; other; oxycodone-acetaminophen; percocet [oxycodone-acetaminophen]; anzemet [dolasetron]; oxycodone; oxycontin [oxycodone hcl]; sulfamethoxazole-trimethoprim; and vicodin [hydrocodone-acetaminophen].  MEDICATIONS:  Current Outpatient Medications  Medication Sig Dispense Refill  . calcium carbonate (OS-CAL) 600 MG TABS Take 600 mg by mouth 2 (two) times daily with a meal.    . cholecalciferol (VITAMIN D) 1000 UNITS tablet Take 1,000 Units by mouth daily.    . fish oil-omega-3 fatty acids 1000 MG capsule Take 4,800 mg by mouth daily.     Marland Kitchen levothyroxine (SYNTHROID, LEVOTHROID) 50 MCG tablet 50 mcg Daily.    . Multiple Vitamins-Minerals (MULTIVITAMIN WITH MINERALS) tablet Take 1 tablet by mouth daily.    . calcium carbonate (TUMS - DOSED IN MG ELEMENTAL CALCIUM) 500 MG chewable tablet Chew 1 tablet by mouth as needed for heartburn.     No current facility-administered medications for this visit.     REVIEW OF SYSTEMS:   Constitutional: Denies fevers, chills, abnormal night sweats, or weight loss Ears, nose, mouth, throat, and face: Denies mucositis or sore throat Respiratory: Denies cough, dyspnea or wheezes Cardiovascular: Denies  palpitation, chest discomfort or lower extremity swelling Gastrointestinal:  Denies nausea, vomiting, constipation, diarrhea, hematochezia, heartburn or change in bowel habits GU: Denies vaginal bleeding  Lymphatics: Denies new lymphadenopathy or easy bruising Neurological:Denies numbness, tingling or new weaknesses Behavioral/Psych: Mood is stable, no new changes  All other systems were reviewed with the patient and are negative.  PHYSICAL EXAMINATION: ECOG PERFORMANCE STATUS: 0 - Asymptomatic  Vitals:   03/11/18 1310  BP: (!) 146/75  Pulse: 67  Resp: 17  Temp: 98.3 F (36.8 C)  SpO2: 99%   Filed Weights   03/11/18 1310  Weight: 135 lb 8 oz (61.5 kg)  GENERAL:alert, no distress and comfortable SKIN: skin color, texture, turgor are normal EYES: sclera clear OROPHARYNX:no thrush or ulcers  LYMPH:  no palpable cervical, supraclavicular, axillary, or inguinal lymphadenopathy LUNGS: clear to auscultation with normal breathing effort HEART: regular rate & rhythm, no lower extremity edema ABDOMEN:abdomen soft, non-tender and normal bowel sounds Musculoskeletal:no cyanosis of digits and no clubbing  PSYCH: alert & oriented x 3 with fluent speech NEURO: no focal motor/sensory deficits  LABORATORY DATA:  I have reviewed the data as listed CBC Latest Ref Rng & Units 03/11/2018 01/03/2014 10/11/2013  WBC 3.9 - 10.3 K/uL 13.0(H) 12.8(H) 12.9(H)  Hemoglobin 11.6 - 15.9 g/dL 12.2 12.0 12.0  Hematocrit 34.8 - 46.6 % 36.8 36.7 36.4  Platelets 145 - 400 K/uL 374 409(H) 388    CMP Latest Ref Rng & Units 04/26/2013 05/04/2012  Glucose 70 - 140 mg/dl 96 98  BUN 7.0 - 26.0 mg/dL 12.7 11.0  Creatinine 0.6 - 1.1 mg/dL 0.8 0.8  Sodium 136 - 145 mEq/L 139 139  Potassium 3.5 - 5.1 mEq/L 4.5 4.7  Chloride 98 - 107 mEq/L - 104  CO2 22 - 29 mEq/L 27 25  Calcium 8.4 - 10.4 mg/dL 9.8 10.0  Total Protein 6.4 - 8.3 g/dL 7.0 6.9  Total Bilirubin 0.20 - 1.20 mg/dL 0.33 0.30  Alkaline Phos 40 - 150  U/L 45 62  AST 5 - 34 U/L 16 18  ALT 0 - 55 U/L 12 17     RADIOGRAPHIC STUDIES: I have personally reviewed the radiological images as listed and agreed with the findings in the report. No results found.  ASSESSMENT & PLAN: Ms. Riede is a pleasant 84 year old female with past medical history of metastatic peritoneal carcinoma with no evidence of disease and chronic leukocytosis   1. Leukocytosis -We reviewed her medical record in detail with the patient. She has chronic mild leukocytosis with lymphocytosis and intermittent thrombocytosis. No other cytopenias. Jak2 was negative. She has no appreciable adenopathy on today's exam, no B symptoms.  -Dr. Burr Medico reviewed the nature of leukocytosis and potential etiologies. Given the mild, indolent nature, suspicion is low for malignancy.  -given her lymphocytosis, will check flow cytometry to rule out CLL.   -Dr. Burr Medico reviewed the likely cause for her leukocytosis and thrombocytosis is related to splenectomy -Repeat CBC today shows mild leukocytosis and predominant lymphocytosis. Platelet count is normal. Iron studies are also normal, which rules out iron deficiency causing her elevated white count and intermittent thrombocytosis -If flow is negative, plan to repeat lab and f/u in 1 year -she will maintain f/u with PCP in the meantime  2. Extraovarian adenocarcinoma, 2006 -S/p TAH/BSO with debulking and splenectomy in 2006 with adjuvant taxol/carboplatin x6 cycles, completed in 2007; s/p salvage chemotherapy taxotere/carboplatin for pulmonary recurrence in 2009, NED -Followed by Dr. Rhodia Albright at [facility] and discharged from f/u in 2017.   3. Family history -She has family history of colon cancer in her father and leukemia and ovarian cancer in her sister. Given her family history and personal history of GYN related malignancy, she qualifies for genetic testing. She has 3 children.  -I plan to discuss with her when I call with her lab results.     PLAN: -Lab today, f/u with results -Discuss genetic testing referral  -Lab, f/u in 1 year  -Continue f/u with PCP  All questions were answered. The patient knows to call the clinic with any problems, questions or concerns. I spent 45 minutes counseling the patient  face to face. The total time spent in the appointment was 60 minutes and more than 50% was on counseling.     Alla Feeling, NP 03/11/18   Addendum  I have seen the patient, examined her. I agree with the assessment and and plan and have edited the notes.   I have reviewed her laboratory results.  She has had mild leukocytosis and mild intermittent thrombocytosis since 2013 (the earliest available CBC in Epic), likely reactive given the overall stable blood counts, possible related to her previous splenectomy. I will check iron study to rule out iron deficiency, which can cause reactive leukocytosis and thrombocytosis.  Her leukocytosis is predominantly lymphocytosis, I will also check peripheral blood flow to rule out CLL or lymphoproliferative disorder. She has no peripheral adenopathy on exam, no B symptoms, suspicion for lymphoma is low.  If the above work-up is negative, I recommend clinical follow-up only.   Truitt Merle  03/11/2018   Addendum  I have reviewed her flow cytometry result and discussed with pathology Dr. Gari Crown. It showed increased T-cell population (mainly large granular lymphocyte (LGL)), likely representing reactive LGL expansion, versus LGL neoplasm such as LGL leukemia.  Patient has had mild lymphocytosis for over 6 years, no neutropenia or other cytopenias, no B symptoms, this is unlikely LGL leukemia.  I think this is likely reactive, or related to her previous splenectomy, and I recommend clinical follow-up.  Her iron study was also normal.  I have spoke with patient and discussed above, she agrees with the plan.  Truitt Merle  03/16/2018

## 2018-03-11 NOTE — Telephone Encounter (Signed)
Appointments scheduled AVS/Calendar printed per 8/7 los

## 2018-03-18 LAB — FLOW CYTOMETRY

## 2018-03-31 ENCOUNTER — Telehealth: Payer: Self-pay

## 2018-03-31 NOTE — Telephone Encounter (Signed)
-----   Message from Truitt Merle, MD sent at 03/31/2018  2:49 PM EDT ----- Please let pt know her lab results, iron level is normal, flow cytometry was negative for CLL, no concerns, continue f/u.  Truitt Merle  03/31/2018

## 2018-03-31 NOTE — Telephone Encounter (Signed)
Left voice message for patient per Dr. Burr Medico, iron level is normal, flow cytometry was negative for CLL, no concerns, we will see you at next f/u.

## 2018-04-13 ENCOUNTER — Other Ambulatory Visit: Payer: Self-pay | Admitting: Internal Medicine

## 2018-04-13 DIAGNOSIS — Z1231 Encounter for screening mammogram for malignant neoplasm of breast: Secondary | ICD-10-CM

## 2018-04-17 DIAGNOSIS — Z23 Encounter for immunization: Secondary | ICD-10-CM | POA: Diagnosis not present

## 2018-05-11 ENCOUNTER — Ambulatory Visit
Admission: RE | Admit: 2018-05-11 | Discharge: 2018-05-11 | Disposition: A | Discharge status: Home / Self Care | Payer: Medicare HMO | Source: Ambulatory Visit | Attending: Internal Medicine | Admitting: Internal Medicine

## 2018-05-11 DIAGNOSIS — Z1231 Encounter for screening mammogram for malignant neoplasm of breast: Secondary | ICD-10-CM

## 2018-05-11 IMAGING — MG DIGITAL SCREENING BILATERAL MAMMOGRAM WITH CAD
4 series · 4 of 4 positions shown · non-contrast
Comparison: Previous exam(s).

CLINICAL DATA: Screening.

EXAM:
DIGITAL SCREENING BILATERAL MAMMOGRAM WITH CAD

[L MLO]
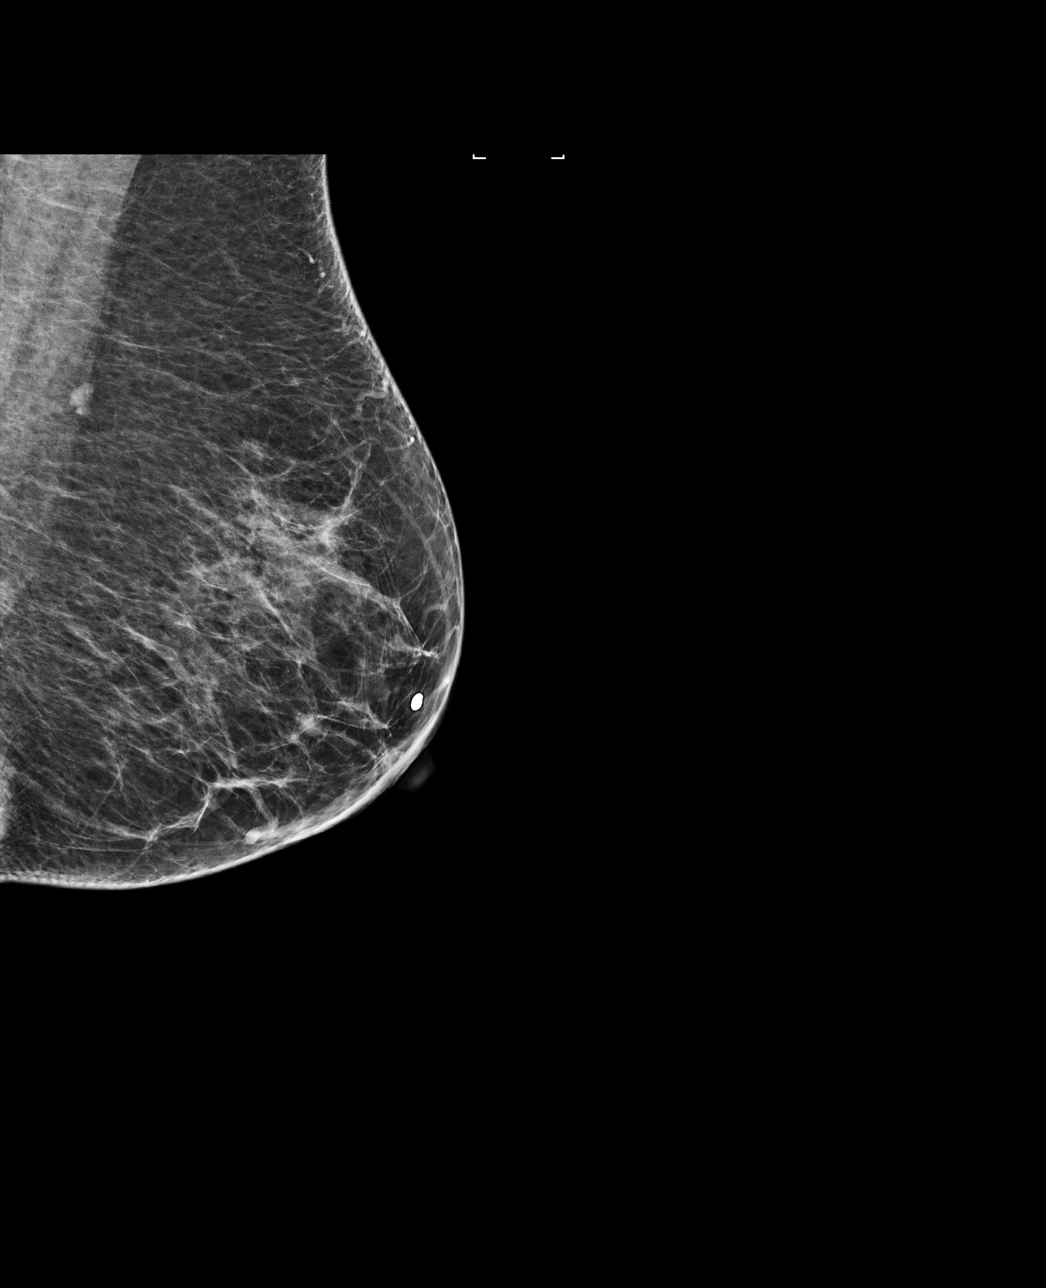

[R MLO]
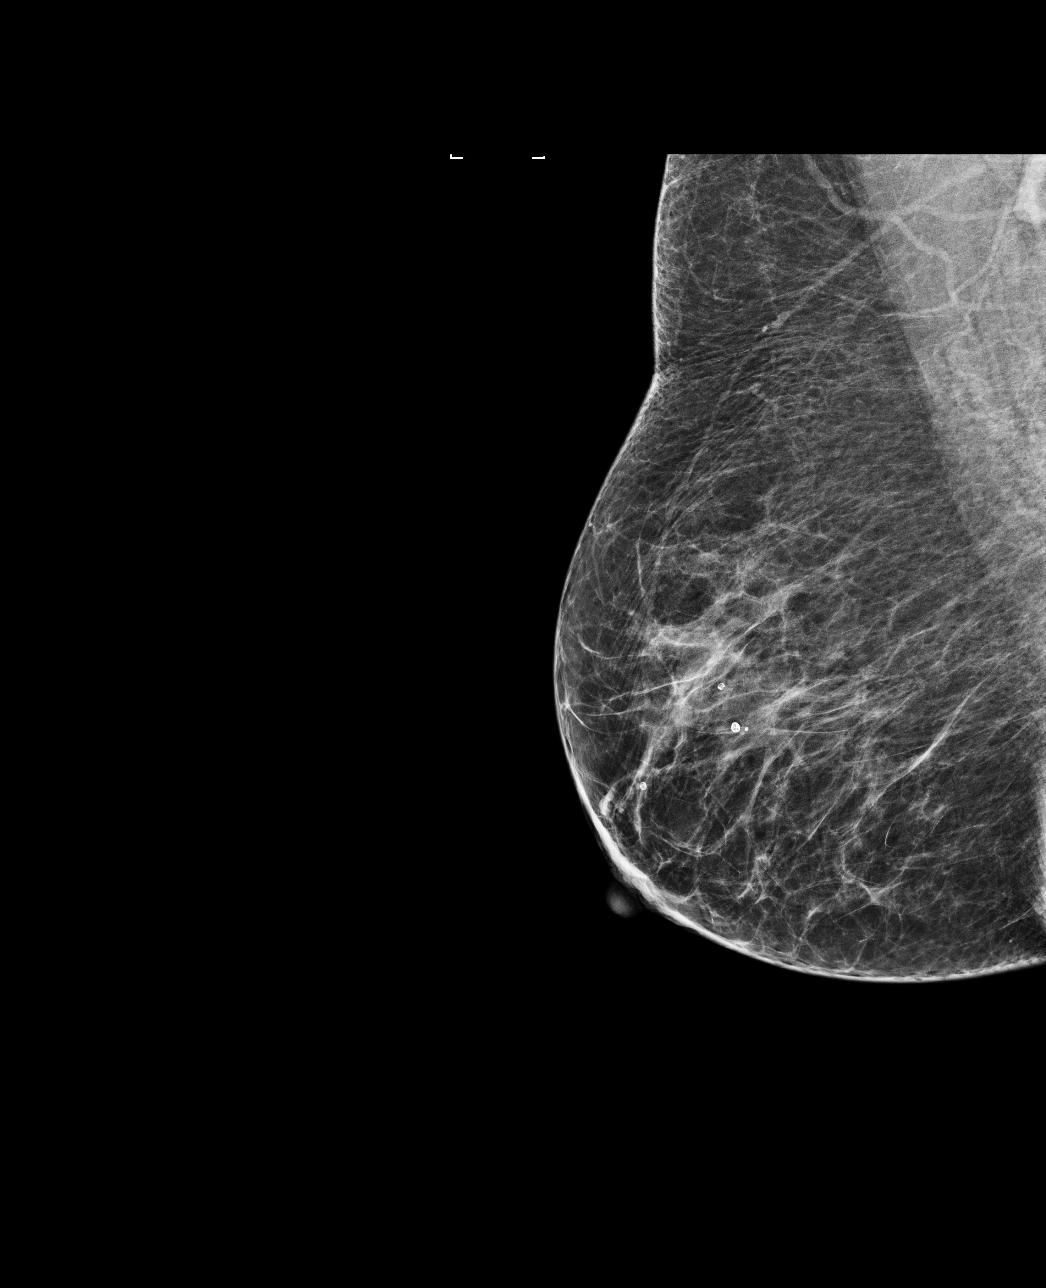

[L CC]
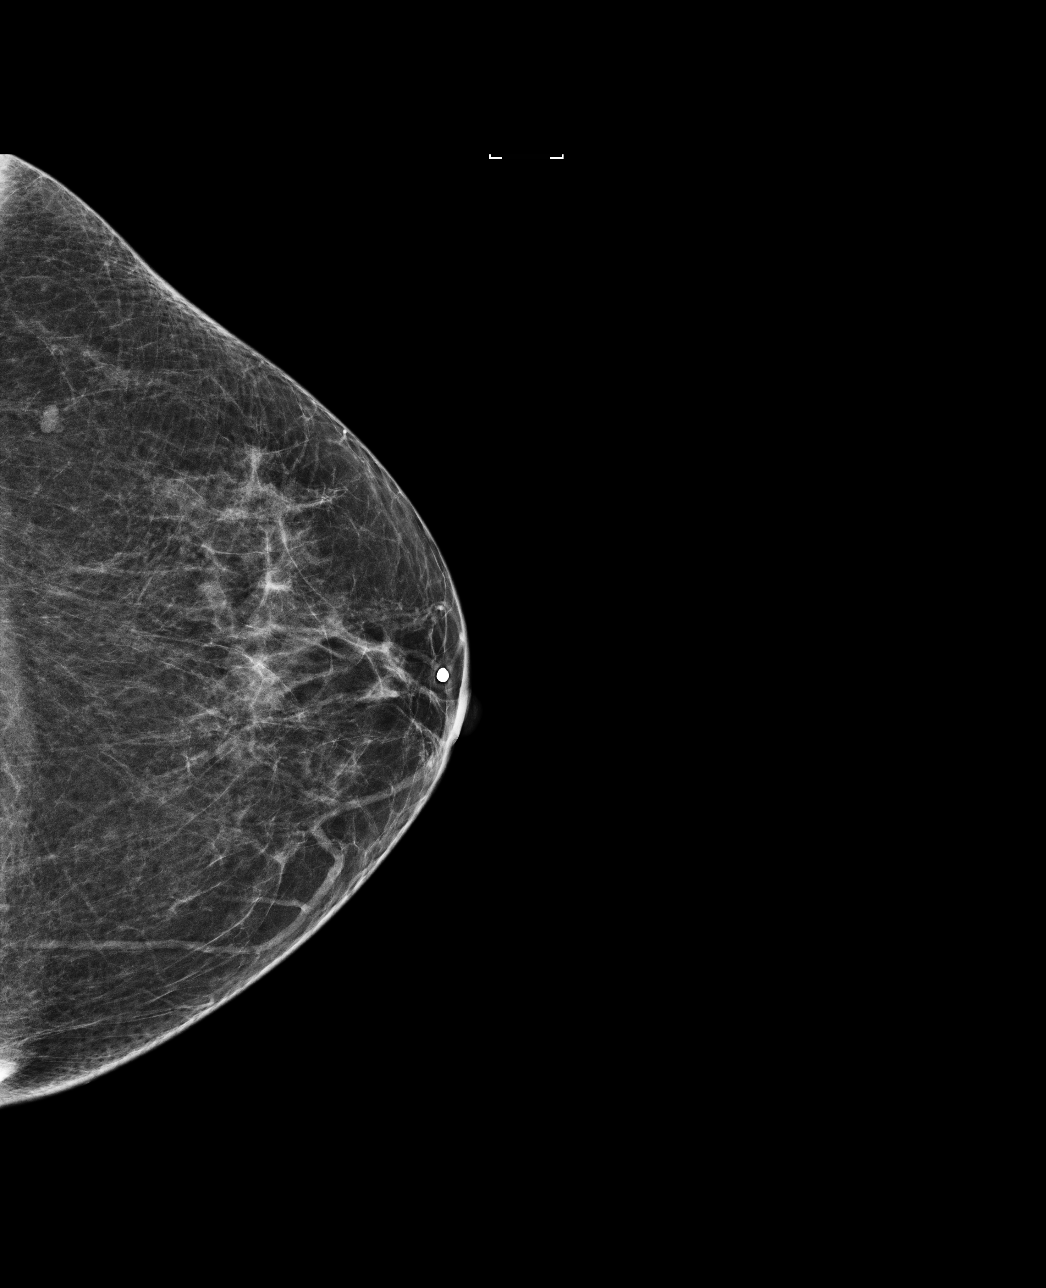

[R CC]
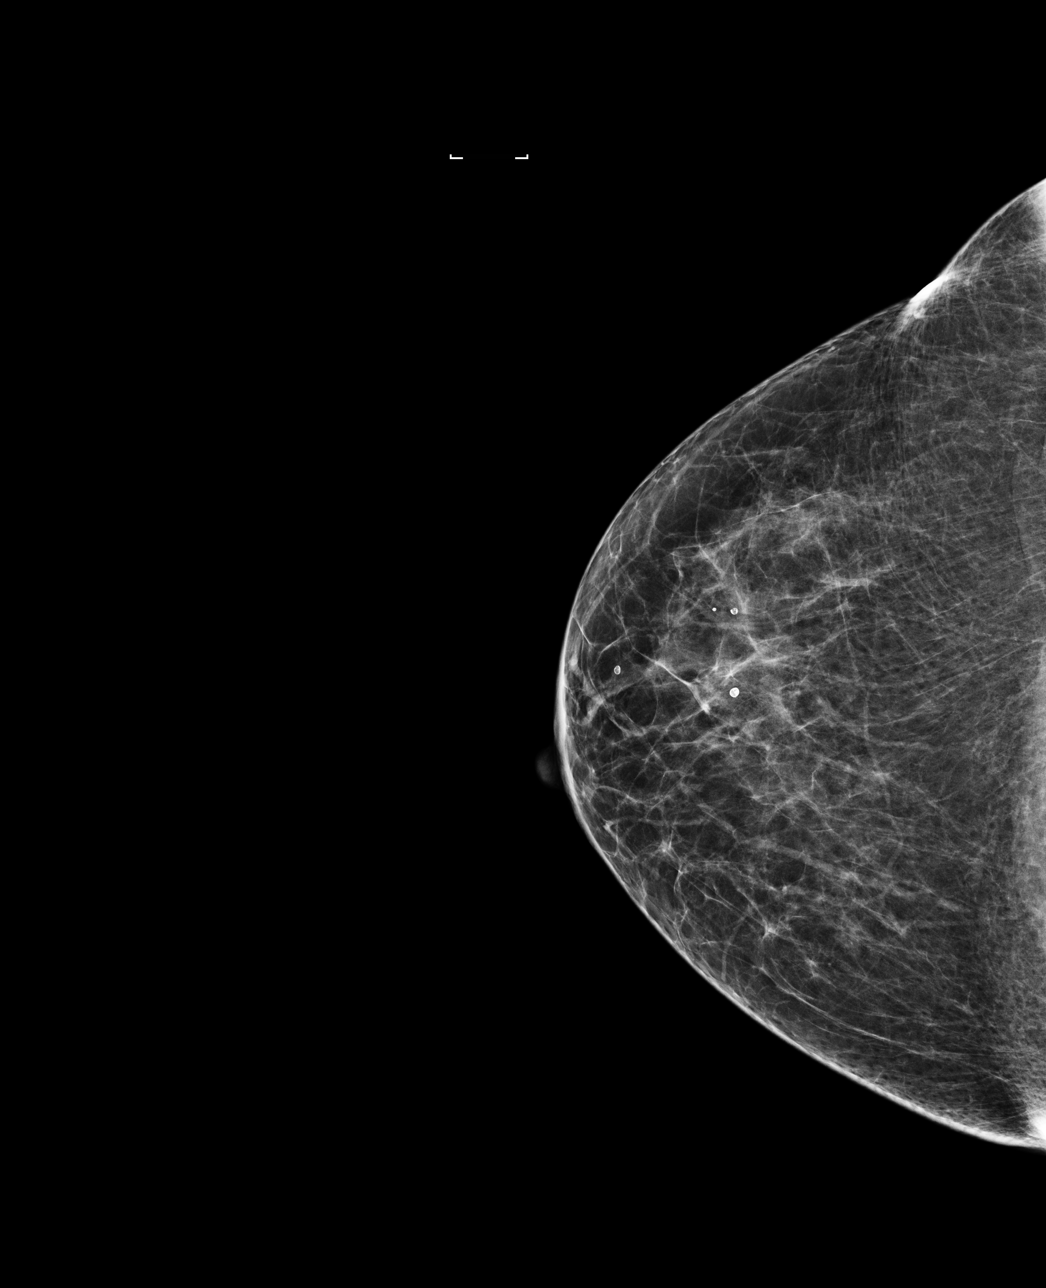

[4 of 4 positions shown; findings below may reference images not displayed]

ACR Breast Density Category b: There are scattered areas of
fibroglandular density.
FINDINGS: There are no findings suspicious for malignancy. Images were
processed with CAD.
IMPRESSION: No mammographic evidence of malignancy. A result letter of this
screening mammogram will be mailed directly to the patient.

RECOMMENDATION:
Screening mammogram in one year. (Code:[US])

BI-RADS CATEGORY  1: Negative.

## 2018-07-01 DIAGNOSIS — H524 Presbyopia: Secondary | ICD-10-CM | POA: Diagnosis not present

## 2018-07-01 DIAGNOSIS — H26491 Other secondary cataract, right eye: Secondary | ICD-10-CM | POA: Diagnosis not present

## 2018-07-01 DIAGNOSIS — H43813 Vitreous degeneration, bilateral: Secondary | ICD-10-CM | POA: Diagnosis not present

## 2018-07-01 DIAGNOSIS — H0100A Unspecified blepharitis right eye, upper and lower eyelids: Secondary | ICD-10-CM | POA: Diagnosis not present

## 2019-01-18 DIAGNOSIS — L255 Unspecified contact dermatitis due to plants, except food: Secondary | ICD-10-CM | POA: Diagnosis not present

## 2019-01-18 DIAGNOSIS — T63421A Toxic effect of venom of ants, accidental (unintentional), initial encounter: Secondary | ICD-10-CM | POA: Diagnosis not present

## 2019-02-11 DIAGNOSIS — H0014 Chalazion left upper eyelid: Secondary | ICD-10-CM | POA: Diagnosis not present

## 2019-02-15 ENCOUNTER — Encounter: Payer: Self-pay | Admitting: Hematology

## 2019-02-15 DIAGNOSIS — Z0000 Encounter for general adult medical examination without abnormal findings: Secondary | ICD-10-CM | POA: Diagnosis not present

## 2019-02-15 DIAGNOSIS — E039 Hypothyroidism, unspecified: Secondary | ICD-10-CM | POA: Diagnosis not present

## 2019-02-15 DIAGNOSIS — E559 Vitamin D deficiency, unspecified: Secondary | ICD-10-CM | POA: Diagnosis not present

## 2019-02-15 DIAGNOSIS — M810 Age-related osteoporosis without current pathological fracture: Secondary | ICD-10-CM | POA: Diagnosis not present

## 2019-02-18 ENCOUNTER — Telehealth: Payer: Self-pay | Admitting: Hematology

## 2019-02-18 NOTE — Telephone Encounter (Signed)
[place] 8/5 appointments moved from 8/5 to 8/26. Confirmed with patient. Schedule mailed.

## 2019-02-22 DIAGNOSIS — M810 Age-related osteoporosis without current pathological fracture: Secondary | ICD-10-CM | POA: Diagnosis not present

## 2019-02-22 DIAGNOSIS — Z01419 Encounter for gynecological examination (general) (routine) without abnormal findings: Secondary | ICD-10-CM | POA: Diagnosis not present

## 2019-02-22 DIAGNOSIS — M8589 Other specified disorders of bone density and structure, multiple sites: Secondary | ICD-10-CM | POA: Diagnosis not present

## 2019-02-22 DIAGNOSIS — Z0000 Encounter for general adult medical examination without abnormal findings: Secondary | ICD-10-CM | POA: Diagnosis not present

## 2019-03-10 ENCOUNTER — Other Ambulatory Visit: Payer: Medicare HMO

## 2019-03-10 ENCOUNTER — Ambulatory Visit: Payer: Medicare HMO | Admitting: Hematology

## 2019-03-26 NOTE — Progress Notes (Signed)
[place]   Telephone:(336) [phone] Fax:(336) [phone]   Clinic Follow up Note   Patient Care Team: Deland Pretty, MD as PCP - General (Internal Medicine) Nobie Putnam, MD (Hematology and Oncology) Fay Records, MD as Referring Physician (Obstetrics and Gynecology) Jodi Marble, MD (Otolaryngology)  Date of Service:  03/31/2019  CHIEF COMPLAINT: F/u of leukocytosis   CURRENT THERAPY:  Observation  INTERVAL HISTORY:  Elaine [name] is here for a follow up leukocytosis. She presents to the clinic alone. She notes she is doing well. She notes she has been staying home and is able to remain active. She notes her BP was elevated at her last PCP visit, but normal at home. She denies Ha or chest discomfort with elevated BP today. I reviewed her medication list with her. She notes she had ant bite which lead to reaction. She was given prednisone for treatment in 01/2019, has resolved. She denies night sweats or weight loss.     REVIEW OF SYSTEMS:   Constitutional: Denies fevers, chills or abnormal weight loss Eyes: Denies blurriness of vision Ears, nose, mouth, throat, and face: Denies mucositis or sore throat Respiratory: Denies cough, dyspnea or wheezes Cardiovascular: Denies palpitation, chest discomfort or lower extremity swelling Gastrointestinal:  Denies nausea, heartburn or change in bowel habits Skin: Denies abnormal skin rashes Lymphatics: Denies new lymphadenopathy or easy bruising Neurological:Denies numbness, tingling or new weaknesses Behavioral/Psych: Mood is stable, no new changes  All other systems were reviewed with the patient and are negative.  MEDICAL HISTORY:  Past Medical History:  Diagnosis Date  . Acid reflux   . Hypothyroid   . Leukocytosis   . Neuropathy    secondary to chemo x 2  . Osteopenia   . Peritoneal carcinoma ([place]) 2006   with lung met; at [facility]; Dr. Meriam Sprague.     SURGICAL HISTORY: Past Surgical History:  Procedure  Laterality Date  . ABDOMINAL HYSTERECTOMY  2006  . COLONOSCOPY  2012   neg.   . OOPHORECTOMY  2006  . SPLENECTOMY  2006  . TONSILLECTOMY    . TOTAL ABDOMINAL HYSTERECTOMY W/ BILATERAL SALPINGOOPHORECTOMY  2006    I have reviewed the social history and family history with the patient and they are unchanged from previous note.  ALLERGIES:  is allergic to aleve [naproxen sodium]; belladonna alkaloids; ciprofloxacin; other; oxycodone-acetaminophen; percocet [oxycodone-acetaminophen]; anzemet [dolasetron]; oxycodone; oxycontin [oxycodone hcl]; sulfamethoxazole-trimethoprim; and vicodin [hydrocodone-acetaminophen].  MEDICATIONS:  Current Outpatient Medications  Medication Sig Dispense Refill  . calcium carbonate (OS-CAL) 600 MG TABS Take 600 mg by mouth 2 (two) times daily with a meal.    . calcium carbonate (TUMS - DOSED IN MG ELEMENTAL CALCIUM) 500 MG chewable tablet Chew 1 tablet by mouth as needed for heartburn.    . cholecalciferol (VITAMIN D) 1000 UNITS tablet Take 1,000 Units by mouth daily.    . fish oil-omega-3 fatty acids 1000 MG capsule Take 4,800 mg by mouth daily.     Marland Kitchen levothyroxine (SYNTHROID, LEVOTHROID) 50 MCG tablet 50 mcg Daily.    . Multiple Vitamins-Minerals (MULTIVITAMIN WITH MINERALS) tablet Take 1 tablet by mouth daily.     No current facility-administered medications for this visit.     PHYSICAL EXAMINATION: ECOG PERFORMANCE STATUS: 0 - Asymptomatic  Vitals:   03/31/19 1346 03/31/19 1347  BP: (!) 165/76 (!) 180/82  Pulse: 63   Resp: 17   Temp: 98.9 F (37.2 C)   SpO2: 99%    Filed Weights   03/31/19 1346  Weight: 127 lb 12.8 oz (58 kg)    GENERAL:alert, no distress and comfortable SKIN: skin color, texture, turgor are normal, no rashes or significant lesions EYES: normal, Conjunctiva are pink and non-injected, sclera clear  NECK: supple, thyroid normal size, non-tender, without nodularity LYMPH:  no palpable lymphadenopathy in the cervical, axillary   LUNGS: clear to auscultation and percussion with normal breathing effort HEART: regular rate & rhythm and no murmurs and no lower extremity edema ABDOMEN:abdomen soft, non-tender and normal bowel sounds Musculoskeletal:no cyanosis of digits and no clubbing  NEURO: alert & oriented x 3 with fluent speech, no focal motor/sensory deficits  LABORATORY DATA:  I have reviewed the data as listed CBC Latest Ref Rng & Units 03/31/2019 03/11/2018 01/03/2014  WBC 4.0 - 10.5 K/uL 14.6(H) 13.0(H) 12.8(H)  Hemoglobin 12.0 - 15.0 g/dL 12.0 12.2 12.0  Hematocrit 36.0 - 46.0 % 36.1 36.8 36.7  Platelets 150 - 400 K/uL 439(H) 374 409(H)     CMP Latest Ref Rng & Units 04/26/2013 05/04/2012  Glucose 70 - 140 mg/dl 96 98  BUN 7.0 - 26.0 mg/dL 12.7 11.0  Creatinine 0.6 - 1.1 mg/dL 0.8 0.8  Sodium 136 - 145 mEq/L 139 139  Potassium 3.5 - 5.1 mEq/L 4.5 4.7  Chloride 98 - 107 mEq/L - 104  CO2 22 - 29 mEq/L 27 25  Calcium 8.4 - 10.4 mg/dL 9.8 10.0  Total Protein 6.4 - 8.3 g/dL 7.0 6.9  Total Bilirubin 0.20 - 1.20 mg/dL 0.33 0.30  Alkaline Phos 40 - 150 U/L 45 62  AST 5 - 34 U/L 16 18  ALT 0 - 55 U/L 12 17      RADIOGRAPHIC STUDIES: I have personally reviewed the radiological images as listed and agreed with the findings in the report. No results found.   ASSESSMENT & PLAN:  Elaine [name] is a 85 y.o. female with   1. Leukocytosis, likely reactive  -We previously reviewed her medical record in detail with the patient. She has chronic mild leukocytosis with lymphocytosis and intermittent thrombocytosis. No other cytopenias. Jak2 was negative. She has no appreciable adenopathy on exam, no B symptoms.  -flow cytometry result and discussed with pathology Dr. Gari Crown. It showed increased T-cell population (mainly large granular lymphocyte (LGL)), likely representing reactive LGL expansion, versus LGL neoplasm such as LGL leukemia.   -Patient has had mild lymphocytosis for over 6 years, no neutropenia or other  cytopenias, no B symptoms, this is unlikely LGL leukemia.  I think this is likely reactive, or related to her previous splenectomy, and I recommend clinical follow-up. -She is clinically doing very well. Physical exam unremarkable today. Labs reviewed, CBC WNL except WBC 14.6, PLT 439K, Lymphocytes 6.6K. I discussed at this level we will continue observation.  -f/u in 1 year. If still stable in 2-3 years she can continue being monitored by her PCP.  -I encouraged her to watch for signs of infection and watch what medications she takes as infection and meds such as steroids can increase her WBC.   2. Extraovarian adenocarcinoma, 2006 -S/p TAH/BSO with debulking and splenectomy in 2006 with adjuvant taxol/carboplatin x6 cycles, completed in 2007; s/p salvage chemotherapy taxotere/carboplatin for pulmonary recurrence in 2009, NED -Followed by Dr. Rhodia Albright at [facility] and discharged from f/u in 2017.   3. Family history -She has family history of colon cancer in her father and leukemia and ovarian cancer in her sister. Given her family history and personal history of GYN related malignancy, she qualifies for  genetic testing. She has 3 children.  -I plan to discuss with her when I call with her lab results.   4. Elevated BP  -!80/82 today (03/31/19), she contributes to white coat syndrome  -BP has been normal at home. Will continue to monitor.    PLAN: -She is clinically doing very well, lab reviewed, no concerns -Lab and f/u in 1 year    No problem-specific Assessment & Plan notes found for this encounter.   No orders of the defined types were placed in this encounter.  All questions were answered. The patient knows to call the clinic with any problems, questions or concerns. No barriers to learning was detected. I spent 10 minutes counseling the patient face to face. The total time spent in the appointment was 15 minutes and more than 50% was on counseling and review of test results     Truitt Merle, MD 03/31/2019   I, Joslyn Devon, am acting as scribe for Truitt Merle, MD.   I have reviewed the above documentation for accuracy and completeness, and I agree with the above.

## 2019-03-31 ENCOUNTER — Inpatient Hospital Stay (HOSPITAL_BASED_OUTPATIENT_CLINIC_OR_DEPARTMENT_OTHER): Payer: Medicare HMO | Admitting: Hematology

## 2019-03-31 ENCOUNTER — Encounter: Payer: Self-pay | Admitting: Hematology

## 2019-03-31 ENCOUNTER — Other Ambulatory Visit: Payer: Self-pay

## 2019-03-31 ENCOUNTER — Telehealth: Payer: Self-pay | Admitting: Hematology

## 2019-03-31 ENCOUNTER — Inpatient Hospital Stay: Payer: Medicare HMO | Attending: Hematology

## 2019-03-31 VITALS — BP 180/82 | HR 63 | Temp 98.9°F | Resp 17 | Ht 63.0 in | Wt 127.8 lb

## 2019-03-31 DIAGNOSIS — D72829 Elevated white blood cell count, unspecified: Secondary | ICD-10-CM

## 2019-03-31 DIAGNOSIS — M8580 Other specified disorders of bone density and structure, unspecified site: Secondary | ICD-10-CM | POA: Insufficient documentation

## 2019-03-31 DIAGNOSIS — E039 Hypothyroidism, unspecified: Secondary | ICD-10-CM | POA: Insufficient documentation

## 2019-03-31 DIAGNOSIS — Z800 Family history of malignant neoplasm of digestive organs: Secondary | ICD-10-CM | POA: Diagnosis not present

## 2019-03-31 DIAGNOSIS — R7989 Other specified abnormal findings of blood chemistry: Secondary | ICD-10-CM | POA: Diagnosis not present

## 2019-03-31 DIAGNOSIS — Z8041 Family history of malignant neoplasm of ovary: Secondary | ICD-10-CM | POA: Insufficient documentation

## 2019-03-31 DIAGNOSIS — Z90722 Acquired absence of ovaries, bilateral: Secondary | ICD-10-CM | POA: Insufficient documentation

## 2019-03-31 DIAGNOSIS — K219 Gastro-esophageal reflux disease without esophagitis: Secondary | ICD-10-CM | POA: Insufficient documentation

## 2019-03-31 DIAGNOSIS — Z8543 Personal history of malignant neoplasm of ovary: Secondary | ICD-10-CM | POA: Insufficient documentation

## 2019-03-31 DIAGNOSIS — Z9081 Acquired absence of spleen: Secondary | ICD-10-CM | POA: Insufficient documentation

## 2019-03-31 DIAGNOSIS — Z79899 Other long term (current) drug therapy: Secondary | ICD-10-CM | POA: Diagnosis not present

## 2019-03-31 DIAGNOSIS — Z90710 Acquired absence of both cervix and uterus: Secondary | ICD-10-CM | POA: Insufficient documentation

## 2019-03-31 DIAGNOSIS — D72820 Lymphocytosis (symptomatic): Secondary | ICD-10-CM | POA: Diagnosis not present

## 2019-03-31 DIAGNOSIS — Z806 Family history of leukemia: Secondary | ICD-10-CM | POA: Insufficient documentation

## 2019-03-31 DIAGNOSIS — Z8589 Personal history of malignant neoplasm of other organs and systems: Secondary | ICD-10-CM | POA: Diagnosis not present

## 2019-03-31 LAB — CBC WITH DIFFERENTIAL (CANCER CENTER ONLY)
Abs Immature Granulocytes: 0.04 10*3/uL (ref 0.00–0.07)
Basophils Absolute: 0.1 10*3/uL (ref 0.0–0.1)
Basophils Relative: 1 %
Eosinophils Absolute: 0.2 10*3/uL (ref 0.0–0.5)
Eosinophils Relative: 1 %
HCT: 36.1 % (ref 36.0–46.0)
Hemoglobin: 12 g/dL (ref 12.0–15.0)
Immature Granulocytes: 0 %
Lymphocytes Relative: 45 %
Lymphs Abs: 6.6 10*3/uL — ABNORMAL HIGH (ref 0.7–4.0)
MCH: 31.4 pg (ref 26.0–34.0)
MCHC: 33.2 g/dL (ref 30.0–36.0)
MCV: 94.5 fL (ref 80.0–100.0)
Monocytes Absolute: 0.8 10*3/uL (ref 0.1–1.0)
Monocytes Relative: 6 %
Neutro Abs: 6.8 10*3/uL (ref 1.7–7.7)
Neutrophils Relative %: 47 %
Platelet Count: 439 10*3/uL — ABNORMAL HIGH (ref 150–400)
RBC: 3.82 MIL/uL — ABNORMAL LOW (ref 3.87–5.11)
RDW: 13.9 % (ref 11.5–15.5)
WBC Count: 14.6 10*3/uL — ABNORMAL HIGH (ref 4.0–10.5)
nRBC: 0 % (ref 0.0–0.2)

## 2019-03-31 NOTE — Telephone Encounter (Signed)
Scheduled appt per 8/26 los.  Printed and mailed appt calendar.

## 2019-04-09 ENCOUNTER — Other Ambulatory Visit: Payer: Self-pay | Admitting: Internal Medicine

## 2019-04-09 DIAGNOSIS — Z23 Encounter for immunization: Secondary | ICD-10-CM | POA: Diagnosis not present

## 2019-04-09 DIAGNOSIS — Z1231 Encounter for screening mammogram for malignant neoplasm of breast: Secondary | ICD-10-CM

## 2019-05-25 ENCOUNTER — Ambulatory Visit: Payer: Medicare HMO

## 2019-06-21 DIAGNOSIS — B373 Candidiasis of vulva and vagina: Secondary | ICD-10-CM | POA: Diagnosis not present

## 2019-06-21 DIAGNOSIS — N898 Other specified noninflammatory disorders of vagina: Secondary | ICD-10-CM | POA: Diagnosis not present

## 2019-06-21 DIAGNOSIS — R300 Dysuria: Secondary | ICD-10-CM | POA: Diagnosis not present

## 2019-06-28 DIAGNOSIS — B373 Candidiasis of vulva and vagina: Secondary | ICD-10-CM | POA: Diagnosis not present

## 2019-06-28 DIAGNOSIS — R238 Other skin changes: Secondary | ICD-10-CM | POA: Diagnosis not present

## 2019-07-06 DIAGNOSIS — B009 Herpesviral infection, unspecified: Secondary | ICD-10-CM | POA: Diagnosis not present

## 2019-08-10 DIAGNOSIS — C4490 Unspecified malignant neoplasm of skin, unspecified: Secondary | ICD-10-CM | POA: Diagnosis not present

## 2019-08-11 DIAGNOSIS — C4441 Basal cell carcinoma of skin of scalp and neck: Secondary | ICD-10-CM | POA: Diagnosis not present

## 2019-08-31 DIAGNOSIS — C4441 Basal cell carcinoma of skin of scalp and neck: Secondary | ICD-10-CM | POA: Diagnosis not present

## 2019-08-31 DIAGNOSIS — Z85828 Personal history of other malignant neoplasm of skin: Secondary | ICD-10-CM | POA: Diagnosis not present

## 2019-09-10 DIAGNOSIS — H04123 Dry eye syndrome of bilateral lacrimal glands: Secondary | ICD-10-CM | POA: Diagnosis not present

## 2019-09-10 DIAGNOSIS — H43813 Vitreous degeneration, bilateral: Secondary | ICD-10-CM | POA: Diagnosis not present

## 2019-09-10 DIAGNOSIS — H524 Presbyopia: Secondary | ICD-10-CM | POA: Diagnosis not present

## 2019-09-10 DIAGNOSIS — H26491 Other secondary cataract, right eye: Secondary | ICD-10-CM | POA: Diagnosis not present

## 2019-10-01 ENCOUNTER — Ambulatory Visit: Payer: Medicare HMO | Attending: Internal Medicine

## 2019-10-01 DIAGNOSIS — Z23 Encounter for immunization: Secondary | ICD-10-CM | POA: Insufficient documentation

## 2019-10-01 NOTE — Progress Notes (Signed)
   Covid-19 Vaccination Clinic  Name:  HELI DINO    MRN: [id number] DOB: 1931/09/03  10/01/2019  Ms. Ramakrishnan was observed post Covid-19 immunization for 15 minutes without incidence. She was provided with Vaccine Information Sheet and instruction to access the V-Safe system.   Ms. Madrigal was instructed to call 911 with any severe reactions post vaccine: Marland Kitchen Difficulty breathing  . Swelling of your face and throat  . A fast heartbeat  . A bad rash all over your body  . Dizziness and weakness    Immunizations Administered    Name Date Dose VIS Date Route   Pfizer COVID-19 Vaccine 10/01/2019  1:35 PM 0.3 mL 07/16/2019 Intramuscular   Manufacturer: [place]   Lot: [id number]   [place]: [address]-6

## 2019-10-27 ENCOUNTER — Ambulatory Visit: Payer: Medicare HMO | Attending: Internal Medicine

## 2019-10-27 DIAGNOSIS — Z23 Encounter for immunization: Secondary | ICD-10-CM

## 2019-10-27 NOTE — Progress Notes (Signed)
   Covid-19 Vaccination Clinic  Name:  MARCUS GROLL    MRN: [id number] DOB: 1932-03-17  10/27/2019  Ms. Franchini was observed post Covid-19 immunization for 15 minutes without incident. She was provided with Vaccine Information Sheet and instruction to access the V-Safe system.   Ms. Ziesmer was instructed to call 911 with any severe reactions post vaccine: Marland Kitchen Difficulty breathing  . Swelling of face and throat  . A fast heartbeat  . A bad rash all over body  . Dizziness and weakness   Immunizations Administered    Name Date Dose VIS Date Route   Pfizer COVID-19 Vaccine 10/27/2019 12:45 PM 0.3 mL 07/16/2019 Intramuscular   Manufacturer: [place]   Lot: [id number]   [place]: [id number]

## 2020-02-17 DIAGNOSIS — Z0000 Encounter for general adult medical examination without abnormal findings: Secondary | ICD-10-CM | POA: Diagnosis not present

## 2020-02-17 DIAGNOSIS — M810 Age-related osteoporosis without current pathological fracture: Secondary | ICD-10-CM | POA: Diagnosis not present

## 2020-02-17 DIAGNOSIS — E7800 Pure hypercholesterolemia, unspecified: Secondary | ICD-10-CM | POA: Diagnosis not present

## 2020-02-17 DIAGNOSIS — E039 Hypothyroidism, unspecified: Secondary | ICD-10-CM | POA: Diagnosis not present

## 2020-02-24 DIAGNOSIS — Z0000 Encounter for general adult medical examination without abnormal findings: Secondary | ICD-10-CM | POA: Diagnosis not present

## 2020-02-24 DIAGNOSIS — Z01419 Encounter for gynecological examination (general) (routine) without abnormal findings: Secondary | ICD-10-CM | POA: Diagnosis not present

## 2020-03-13 DIAGNOSIS — H0100B Unspecified blepharitis left eye, upper and lower eyelids: Secondary | ICD-10-CM | POA: Diagnosis not present

## 2020-03-13 DIAGNOSIS — H0100A Unspecified blepharitis right eye, upper and lower eyelids: Secondary | ICD-10-CM | POA: Diagnosis not present

## 2020-03-13 DIAGNOSIS — H0012 Chalazion right lower eyelid: Secondary | ICD-10-CM | POA: Diagnosis not present

## 2020-03-16 DIAGNOSIS — Z85828 Personal history of other malignant neoplasm of skin: Secondary | ICD-10-CM | POA: Diagnosis not present

## 2020-03-16 DIAGNOSIS — L821 Other seborrheic keratosis: Secondary | ICD-10-CM | POA: Diagnosis not present

## 2020-03-16 DIAGNOSIS — D1801 Hemangioma of skin and subcutaneous tissue: Secondary | ICD-10-CM | POA: Diagnosis not present

## 2020-03-16 DIAGNOSIS — L814 Other melanin hyperpigmentation: Secondary | ICD-10-CM | POA: Diagnosis not present

## 2020-03-16 DIAGNOSIS — D225 Melanocytic nevi of trunk: Secondary | ICD-10-CM | POA: Diagnosis not present

## 2020-03-29 NOTE — Progress Notes (Signed)
[place]   Telephone:(336) [phone] Fax:(336) [phone]   Clinic Follow up Note   Patient Care Team: Deland Pretty, MD as PCP - General (Internal Medicine) Nobie Putnam, MD (Hematology and Oncology) Fay Records, MD as Referring Physician (Obstetrics and Gynecology) Jodi Marble, MD (Otolaryngology) Truitt Merle, MD as Consulting Physician (Hematology) Alla Feeling, NP as Nurse Practitioner (Nurse Practitioner) 03/30/2020  CHIEF COMPLAINT: Follow-up leukocytosis  CURRENT THERAPY: Observation  INTERVAL HISTORY: Ms. Kaufman returns for annual follow-up as scheduled.  She was last seen in clinic on 03/31/2019. She is being seen by me virtually from my home office. She feels great. She continues to walk daily. She has lost few pounds intentionally. She does not go out much. She had a skin cancer removed from top of her head, she states dermatologist also checked all her lymph nodes. Denies fever, night sweats, adenopathy, infection, bleeding, or other concerns. She is up to date on cancer screenings.     MEDICAL HISTORY:  Past Medical History:  Diagnosis Date  . Acid reflux   . Hypothyroid   . Leukocytosis   . Neuropathy    secondary to chemo x 2  . Osteopenia   . Peritoneal carcinoma ([place]) 2006   with lung met; at [facility]; Dr. Meriam Sprague.     SURGICAL HISTORY: Past Surgical History:  Procedure Laterality Date  . ABDOMINAL HYSTERECTOMY  2006  . COLONOSCOPY  2012   neg.   . OOPHORECTOMY  2006  . SPLENECTOMY  2006  . TONSILLECTOMY    . TOTAL ABDOMINAL HYSTERECTOMY W/ BILATERAL SALPINGOOPHORECTOMY  2006    I have reviewed the social history and family history with the patient and they are unchanged from previous note.  ALLERGIES:  is allergic to aleve [naproxen sodium], belladonna alkaloids, ciprofloxacin, other, oxycodone-acetaminophen, percocet [oxycodone-acetaminophen], anzemet [dolasetron], oxycodone, oxycontin [oxycodone hcl],  sulfamethoxazole-trimethoprim, and vicodin [hydrocodone-acetaminophen].  MEDICATIONS:  Current Outpatient Medications  Medication Sig Dispense Refill  . calcium carbonate (OS-CAL) 600 MG TABS Take 600 mg by mouth 2 (two) times daily with a meal.    . calcium carbonate (TUMS - DOSED IN MG ELEMENTAL CALCIUM) 500 MG chewable tablet Chew 1 tablet by mouth as needed for heartburn.    . cholecalciferol (VITAMIN D) 1000 UNITS tablet Take 1,000 Units by mouth daily.    . fish oil-omega-3 fatty acids 1000 MG capsule Take 4,800 mg by mouth daily.     Marland Kitchen levothyroxine (SYNTHROID, LEVOTHROID) 50 MCG tablet 50 mcg Daily.    . Multiple Vitamins-Minerals (MULTIVITAMIN WITH MINERALS) tablet Take 1 tablet by mouth daily.     No current facility-administered medications for this visit.    PHYSICAL EXAMINATION: ECOG PERFORMANCE STATUS: 0 - Asymptomatic  Vitals:   03/30/20 1312  BP: (!) 157/80  Pulse: 77  Resp: 20  Temp: 98.8 F (37.1 C)  SpO2: 100%   Filed Weights   03/30/20 1312  Weight: 125 lb 9.6 oz (57 kg)    GENERAL:alert, no distress and comfortable LYMPH:  LN exam not performed in today's virtual visit  LUNGS:  normal breathing effort NEURO: alert & oriented x 3 with fluent speech, normal gait Limited exam due to virtual visit in midst of covid19 pandemic   LABORATORY DATA:  I have reviewed the data as listed CBC Latest Ref Rng & Units 03/30/2020 03/31/2019 03/11/2018  WBC 4.0 - 10.5 K/uL 16.6(H) 14.6(H) 13.0(H)  Hemoglobin 12.0 - 15.0 g/dL 11.9(L) 12.0 12.2  Hematocrit 36 - 46 % 35.9(L) 36.1 36.8  Platelets 150 - 400 K/uL 453(H) 439(H) 374    RADIOGRAPHIC STUDIES: I have personally reviewed the radiological images as listed and agreed with the findings in the report. No results found.   ASSESSMENT & PLAN: JUNELLE HASHEMI is a 86 y.o. female with   1. Leukocytosis, likely reactive  -She has chronic mild leukocytosis with lymphocytosis and intermittent thrombocytosis. Jak2 was  negative. She has no appreciable adenopathy on previous exams, no B symptoms.  -flow cytometry showed increased T-cell population (mainly large granular lymphocyte (LGL)), likely representingreactive LGL expansion, versus LGL neoplasm such as LGL leukemia. -Patient has had mild lymphocytosis for over 7 years, no neutropenia or other cytopenias except hg 11.9 which is her baseline. No B symptoms. This is felt to be reactive or related to previous splenectomy, unlikely LGL leukemia.  -continue observation   2. Skin cancer  -she reports a non-melanoma skin cancer removed from her head recently, f/u with derm was clear   3. Extraovarian adenocarcinoma, 2006 -S/p TAH/BSO with debulking and splenectomy in 2006 with adjuvant taxol/carboplatin x6 cycles, completed in 2007; s/p salvage chemotherapy taxotere/carboplatin for pulmonary recurrence in 2009, NED -Followed by Dr. Rhodia Albright at [facility] and discharged from f/u in 2017.   4. Health maintenance  -I encouraged her to maintain age-appropriate disease prevention, screenings, and vaccines.  -last mammogram in 2019, she plans to repeat this year. She does not plan to have another colonoscopy     Disposition:  Ms. Decker is clinically doing well. No B symptoms. CBC shows WBC 16.6 with mild neutrophils and lymphocytosis, mild thrombocytosis, and hg 11.9. Labs stable overall since 2014.   Since we met virtually today, she was given the option for LN exam by Dr. Burr Medico today or to return in 6 months for close f/u. She opted to return in 6 months, given that her dermatologist recently examined her. She has no other concerning symptoms so this is reasonable.   She knows to call if she develops fever, night sweats, weight loss, or other changes in the interim.   Lab, f/u in 6 months.  All questions were answered. The patient knows to call the clinic with any problems, questions or concerns. No barriers to learning were detected. This was a [employer]  using video-enabled software.      Alla Feeling, NP 03/30/20

## 2020-03-30 ENCOUNTER — Inpatient Hospital Stay: Payer: Medicare HMO | Admitting: Nurse Practitioner

## 2020-03-30 ENCOUNTER — Encounter: Payer: Self-pay | Admitting: Nurse Practitioner

## 2020-03-30 ENCOUNTER — Other Ambulatory Visit: Payer: Self-pay

## 2020-03-30 ENCOUNTER — Inpatient Hospital Stay: Payer: Medicare HMO | Attending: Nurse Practitioner

## 2020-03-30 VITALS — BP 157/80 | HR 77 | Temp 98.8°F | Resp 20 | Ht 63.0 in | Wt 125.6 lb

## 2020-03-30 DIAGNOSIS — Z85831 Personal history of malignant neoplasm of soft tissue: Secondary | ICD-10-CM | POA: Insufficient documentation

## 2020-03-30 DIAGNOSIS — M8580 Other specified disorders of bone density and structure, unspecified site: Secondary | ICD-10-CM | POA: Diagnosis not present

## 2020-03-30 DIAGNOSIS — Z9079 Acquired absence of other genital organ(s): Secondary | ICD-10-CM | POA: Diagnosis not present

## 2020-03-30 DIAGNOSIS — Z85828 Personal history of other malignant neoplasm of skin: Secondary | ICD-10-CM | POA: Diagnosis not present

## 2020-03-30 DIAGNOSIS — E039 Hypothyroidism, unspecified: Secondary | ICD-10-CM | POA: Diagnosis not present

## 2020-03-30 DIAGNOSIS — Z90722 Acquired absence of ovaries, bilateral: Secondary | ICD-10-CM | POA: Diagnosis not present

## 2020-03-30 DIAGNOSIS — Z90710 Acquired absence of both cervix and uterus: Secondary | ICD-10-CM | POA: Insufficient documentation

## 2020-03-30 DIAGNOSIS — Z9221 Personal history of antineoplastic chemotherapy: Secondary | ICD-10-CM | POA: Diagnosis not present

## 2020-03-30 DIAGNOSIS — Z79899 Other long term (current) drug therapy: Secondary | ICD-10-CM | POA: Insufficient documentation

## 2020-03-30 DIAGNOSIS — D72829 Elevated white blood cell count, unspecified: Secondary | ICD-10-CM | POA: Diagnosis not present

## 2020-03-30 DIAGNOSIS — K219 Gastro-esophageal reflux disease without esophagitis: Secondary | ICD-10-CM | POA: Insufficient documentation

## 2020-03-30 LAB — CBC WITH DIFFERENTIAL (CANCER CENTER ONLY)
Abs Immature Granulocytes: 0.07 10*3/uL (ref 0.00–0.07)
Basophils Absolute: 0.1 10*3/uL (ref 0.0–0.1)
Basophils Relative: 1 %
Eosinophils Absolute: 0.2 10*3/uL (ref 0.0–0.5)
Eosinophils Relative: 1 %
HCT: 35.9 % — ABNORMAL LOW (ref 36.0–46.0)
Hemoglobin: 11.9 g/dL — ABNORMAL LOW (ref 12.0–15.0)
Immature Granulocytes: 0 %
Lymphocytes Relative: 45 %
Lymphs Abs: 7.4 10*3/uL — ABNORMAL HIGH (ref 0.7–4.0)
MCH: 30.6 pg (ref 26.0–34.0)
MCHC: 33.1 g/dL (ref 30.0–36.0)
MCV: 92.3 fL (ref 80.0–100.0)
Monocytes Absolute: 0.9 10*3/uL (ref 0.1–1.0)
Monocytes Relative: 5 %
Neutro Abs: 8 10*3/uL — ABNORMAL HIGH (ref 1.7–7.7)
Neutrophils Relative %: 48 %
Platelet Count: 453 10*3/uL — ABNORMAL HIGH (ref 150–400)
RBC: 3.89 MIL/uL (ref 3.87–5.11)
RDW: 14.2 % (ref 11.5–15.5)
WBC Count: 16.6 10*3/uL — ABNORMAL HIGH (ref 4.0–10.5)
nRBC: 0 % (ref 0.0–0.2)

## 2020-03-31 ENCOUNTER — Telehealth: Payer: Self-pay | Admitting: Nurse Practitioner

## 2020-03-31 NOTE — Telephone Encounter (Signed)
Scheduled per 8/26 los. Unable to reach pt. Left voicemail with appt time and date.

## 2020-05-01 DIAGNOSIS — M79632 Pain in left forearm: Secondary | ICD-10-CM | POA: Diagnosis not present

## 2020-05-01 DIAGNOSIS — Z23 Encounter for immunization: Secondary | ICD-10-CM | POA: Diagnosis not present

## 2020-05-04 DIAGNOSIS — M25422 Effusion, left elbow: Secondary | ICD-10-CM | POA: Diagnosis not present

## 2020-05-04 DIAGNOSIS — S52132D Displaced fracture of neck of left radius, subsequent encounter for closed fracture with routine healing: Secondary | ICD-10-CM | POA: Diagnosis not present

## 2020-05-04 DIAGNOSIS — M25522 Pain in left elbow: Secondary | ICD-10-CM | POA: Diagnosis not present

## 2020-05-11 DIAGNOSIS — M25622 Stiffness of left elbow, not elsewhere classified: Secondary | ICD-10-CM | POA: Diagnosis not present

## 2020-05-11 DIAGNOSIS — S52135A Nondisplaced fracture of neck of left radius, initial encounter for closed fracture: Secondary | ICD-10-CM | POA: Diagnosis not present

## 2020-05-26 DIAGNOSIS — S52132D Displaced fracture of neck of left radius, subsequent encounter for closed fracture with routine healing: Secondary | ICD-10-CM | POA: Diagnosis not present

## 2020-06-23 DIAGNOSIS — S52132D Displaced fracture of neck of left radius, subsequent encounter for closed fracture with routine healing: Secondary | ICD-10-CM | POA: Diagnosis not present

## 2020-07-21 DIAGNOSIS — S52132D Displaced fracture of neck of left radius, subsequent encounter for closed fracture with routine healing: Secondary | ICD-10-CM | POA: Diagnosis not present

## 2020-09-07 ENCOUNTER — Other Ambulatory Visit: Payer: Self-pay | Admitting: Internal Medicine

## 2020-09-07 DIAGNOSIS — Z1231 Encounter for screening mammogram for malignant neoplasm of breast: Secondary | ICD-10-CM

## 2020-09-11 DIAGNOSIS — H26491 Other secondary cataract, right eye: Secondary | ICD-10-CM | POA: Diagnosis not present

## 2020-09-11 DIAGNOSIS — H00011 Hordeolum externum right upper eyelid: Secondary | ICD-10-CM | POA: Diagnosis not present

## 2020-09-11 DIAGNOSIS — H43813 Vitreous degeneration, bilateral: Secondary | ICD-10-CM | POA: Diagnosis not present

## 2020-09-18 ENCOUNTER — Telehealth: Payer: Self-pay | Admitting: Nurse Practitioner

## 2020-09-18 NOTE — Telephone Encounter (Addendum)
Attempted to contact patient to reschedule lab appointment and inform patient her provider visit was virtual per 2/14 schedule message. Unable to leave a message. Rescheduled appointment and mailed calendar.

## 2020-09-25 ENCOUNTER — Telehealth: Payer: Self-pay | Admitting: Nurse Practitioner

## 2020-09-25 ENCOUNTER — Other Ambulatory Visit: Payer: Self-pay

## 2020-09-25 ENCOUNTER — Other Ambulatory Visit: Payer: Self-pay | Admitting: Nurse Practitioner

## 2020-09-25 ENCOUNTER — Inpatient Hospital Stay: Payer: Medicare HMO | Attending: Nurse Practitioner

## 2020-09-25 DIAGNOSIS — D72829 Elevated white blood cell count, unspecified: Secondary | ICD-10-CM | POA: Diagnosis not present

## 2020-09-25 DIAGNOSIS — Z90710 Acquired absence of both cervix and uterus: Secondary | ICD-10-CM | POA: Diagnosis not present

## 2020-09-25 DIAGNOSIS — M8580 Other specified disorders of bone density and structure, unspecified site: Secondary | ICD-10-CM | POA: Diagnosis not present

## 2020-09-25 DIAGNOSIS — Z9079 Acquired absence of other genital organ(s): Secondary | ICD-10-CM | POA: Insufficient documentation

## 2020-09-25 DIAGNOSIS — D75839 Thrombocytosis, unspecified: Secondary | ICD-10-CM | POA: Diagnosis not present

## 2020-09-25 DIAGNOSIS — E039 Hypothyroidism, unspecified: Secondary | ICD-10-CM | POA: Insufficient documentation

## 2020-09-25 DIAGNOSIS — D649 Anemia, unspecified: Secondary | ICD-10-CM | POA: Diagnosis not present

## 2020-09-25 DIAGNOSIS — Z85828 Personal history of other malignant neoplasm of skin: Secondary | ICD-10-CM | POA: Diagnosis not present

## 2020-09-25 DIAGNOSIS — Z8589 Personal history of malignant neoplasm of other organs and systems: Secondary | ICD-10-CM | POA: Diagnosis not present

## 2020-09-25 DIAGNOSIS — Z90722 Acquired absence of ovaries, bilateral: Secondary | ICD-10-CM | POA: Diagnosis not present

## 2020-09-25 LAB — CBC WITH DIFFERENTIAL (CANCER CENTER ONLY)
Abs Immature Granulocytes: 0.05 10*3/uL (ref 0.00–0.07)
Basophils Absolute: 0.1 10*3/uL (ref 0.0–0.1)
Basophils Relative: 0 %
Eosinophils Absolute: 0.2 10*3/uL (ref 0.0–0.5)
Eosinophils Relative: 1 %
HCT: 35.2 % — ABNORMAL LOW (ref 36.0–46.0)
Hemoglobin: 11.9 g/dL — ABNORMAL LOW (ref 12.0–15.0)
Immature Granulocytes: 0 %
Lymphocytes Relative: 40 %
Lymphs Abs: 5.7 10*3/uL — ABNORMAL HIGH (ref 0.7–4.0)
MCH: 31.1 pg (ref 26.0–34.0)
MCHC: 33.8 g/dL (ref 30.0–36.0)
MCV: 91.9 fL (ref 80.0–100.0)
Monocytes Absolute: 0.7 10*3/uL (ref 0.1–1.0)
Monocytes Relative: 5 %
Neutro Abs: 7.7 10*3/uL (ref 1.7–7.7)
Neutrophils Relative %: 54 %
Platelet Count: 439 10*3/uL — ABNORMAL HIGH (ref 150–400)
RBC: 3.83 MIL/uL — ABNORMAL LOW (ref 3.87–5.11)
RDW: 14.1 % (ref 11.5–15.5)
WBC Count: 14.4 10*3/uL — ABNORMAL HIGH (ref 4.0–10.5)
nRBC: 0 % (ref 0.0–0.2)

## 2020-09-25 LAB — IRON AND TIBC
Iron: 61 ug/dL (ref 41–142)
Saturation Ratios: 20 % — ABNORMAL LOW (ref 21–57)
TIBC: 296 ug/dL (ref 236–444)
UIBC: 236 ug/dL (ref 120–384)

## 2020-09-25 LAB — FERRITIN: Ferritin: 105 ng/mL (ref 11–307)

## 2020-09-25 NOTE — Telephone Encounter (Signed)
Rescheduled follow-up appointment per patient's request. Patient is aware appointment is virtual.

## 2020-09-26 ENCOUNTER — Inpatient Hospital Stay (HOSPITAL_BASED_OUTPATIENT_CLINIC_OR_DEPARTMENT_OTHER): Payer: Medicare HMO | Admitting: Nurse Practitioner

## 2020-09-26 ENCOUNTER — Telehealth: Payer: Self-pay | Admitting: Nurse Practitioner

## 2020-09-26 ENCOUNTER — Encounter: Payer: Self-pay | Admitting: Nurse Practitioner

## 2020-09-26 DIAGNOSIS — D72829 Elevated white blood cell count, unspecified: Secondary | ICD-10-CM | POA: Diagnosis not present

## 2020-09-26 DIAGNOSIS — Z85828 Personal history of other malignant neoplasm of skin: Secondary | ICD-10-CM | POA: Diagnosis not present

## 2020-09-26 DIAGNOSIS — Z8543 Personal history of malignant neoplasm of ovary: Secondary | ICD-10-CM | POA: Diagnosis not present

## 2020-09-26 NOTE — Telephone Encounter (Signed)
Left message with follow-up appointments per 2/22 los. Gave option to call back to reschedule if needed.

## 2020-09-26 NOTE — Progress Notes (Signed)
[place]   Telephone:(336) [phone] Fax:(336) [phone]   Clinic Follow up Note   Patient Care Team: Deland Pretty, MD as PCP - General (Internal Medicine) Nobie Putnam, MD (Hematology and Oncology) Fay Records, MD as Referring Physician (Obstetrics and Gynecology) Jodi Marble, MD (Otolaryngology) Truitt Merle, MD as Consulting Physician (Hematology) Alla Feeling, NP as Nurse Practitioner (Nurse Practitioner) 09/26/2020  I connected with Juliette Alcide Dalby on 09/26/20 at  9:15 AM EST by telephone visit and verified that I am speaking with the correct person using two identifiers.   I discussed the limitations, risks, security and privacy concerns of performing an evaluation and management service by telemedicine and the availability of in-person appointments. I also discussed with the patient that there may be a patient responsible charge related to this service. The patient expressed understanding and agreed to proceed.   Other persons participating in the visit and their role in the encounter: None  Patient's location: home  Provider's location: home office   CHIEF COMPLAINT: F/up leukocytosis and thrombocytosis   CURRENT THERAPY: Observation  INTERVAL HISTORY: Ms. Elaine [name] is feeling great. She remains active at home but remains covid conscious. Due to bad weather lately she is less active and gained 5 lbs. Denies fever, night sweats, adenopathy, new pain, or other concerns. She takes vitamins and synthroid. UTD on her appropriate screenings, mammo on 2/24. Her annual wellness labs/exam is sometime this summer. She has residual neuropathy in her feet from chemo years ago, improves with massage. A cousin had ovarian cancer. She might be interested in genetic testing for her children if affordable.    MEDICAL HISTORY:  Past Medical History:  Diagnosis Date  . Acid reflux   . Hypothyroid   . Leukocytosis   . Neuropathy    secondary to chemo x 2  . Osteopenia    . Peritoneal carcinoma ([place]) 2006   with lung met; at [facility]; Dr. Meriam Sprague.     SURGICAL HISTORY: Past Surgical History:  Procedure Laterality Date  . ABDOMINAL HYSTERECTOMY  2006  . COLONOSCOPY  2012   neg.   . OOPHORECTOMY  2006  . SPLENECTOMY  2006  . TONSILLECTOMY    . TOTAL ABDOMINAL HYSTERECTOMY W/ BILATERAL SALPINGOOPHORECTOMY  2006    I have reviewed the social history and family history with the patient and they are unchanged from previous note.  ALLERGIES:  is allergic to aleve [naproxen sodium], belladonna alkaloids, ciprofloxacin, other, oxycodone-acetaminophen, percocet [oxycodone-acetaminophen], anzemet [dolasetron], oxycodone, oxycontin [oxycodone hcl], sulfamethoxazole-trimethoprim, and vicodin [hydrocodone-acetaminophen].  MEDICATIONS:  Current Outpatient Medications  Medication Sig Dispense Refill  . calcium carbonate (OS-CAL) 600 MG TABS Take 600 mg by mouth 2 (two) times daily with a meal.    . calcium carbonate (TUMS - DOSED IN MG ELEMENTAL CALCIUM) 500 MG chewable tablet Chew 1 tablet by mouth as needed for heartburn.    . cholecalciferol (VITAMIN D) 1000 UNITS tablet Take 1,000 Units by mouth daily.    . fish oil-omega-3 fatty acids 1000 MG capsule Take 4,800 mg by mouth daily.     Marland Kitchen levothyroxine (SYNTHROID, LEVOTHROID) 50 MCG tablet 50 mcg Daily.    . Multiple Vitamins-Minerals (MULTIVITAMIN WITH MINERALS) tablet Take 1 tablet by mouth daily.     No current facility-administered medications for this visit.    PHYSICAL EXAMINATION: ECOG PERFORMANCE STATUS: 0 - Asymptomatic  There were no vitals filed for this visit. There were no vitals filed for this visit.  Patient appears well over the  phone. Speech is clear. Normal mood/affect. No cough or conversational dyspnea.    LABORATORY DATA:  I have reviewed the data as listed CBC Latest Ref Rng & Units 09/25/2020 03/30/2020 03/31/2019  WBC 4.0 - 10.5 K/uL 14.4(H) 16.6(H) 14.6(H)  Hemoglobin 12.0 -  15.0 g/dL 11.9(L) 11.9(L) 12.0  Hematocrit 36.0 - 46.0 % 35.2(L) 35.9(L) 36.1  Platelets 150 - 400 K/uL 439(H) 453(H) 439(H)     CMP Latest Ref Rng & Units 04/26/2013 05/04/2012  Glucose 70 - 140 mg/dl 96 98  BUN 7.0 - 26.0 mg/dL 12.7 11.0  Creatinine 0.6 - 1.1 mg/dL 0.8 0.8  Sodium 136 - 145 mEq/L 139 139  Potassium 3.5 - 5.1 mEq/L 4.5 4.7  Chloride 98 - 107 mEq/L - 104  CO2 22 - 29 mEq/L 27 25  Calcium 8.4 - 10.4 mg/dL 9.8 10.0  Total Protein 6.4 - 8.3 g/dL 7.0 6.9  Total Bilirubin 0.20 - 1.20 mg/dL 0.33 0.30  Alkaline Phos 40 - 150 U/L 45 62  AST 5 - 34 U/L 16 18  ALT 0 - 55 U/L 12 17      RADIOGRAPHIC STUDIES: I have personally reviewed the radiological images as listed and agreed with the findings in the report. No results found.   ASSESSMENT & PLAN: Laiklyn Pilkenton Massaroniis a 87 y.o.femalewith   1. Leukocytosis, likely reactive -She has chronic mild leukocytosis with lymphocytosis and intermittent thrombocytosis. Jak2 was negative. She has no appreciable adenopathy on previous exams, no B symptoms.  -flow cytometry showed increased T-cell population (mainly large granular lymphocyte (LGL)), likely representingreactive LGL expansion, versus LGL neoplasm such as LGL leukemia. -Patient has had mild lymphocytosis for over 7 years. No neutropenia or other cytopenias except hg 11.9 which is her baseline. No B symptoms. This is felt to be reactive or related to previous splenectomy, unlikely LGL leukemia.  -continue observation   2. Skin cancer  -she reports a non-melanoma skin cancer removed from her head recently -continue f/u with derm   3. Extraovarian adenocarcinoma, 2006 -S/p TAH/BSO with debulking and splenectomy in 2006 with adjuvant taxol/carboplatin x6 cycles, completed in 2007; s/p salvage chemotherapy taxotere/carboplatin for pulmonary recurrence in 2009, NED -Followed by Dr. Rhodia Albright at [facility] and discharged from f/u in 2017.   4. Health maintenance  -last mammo  2019, scheduled on 09/28/20 -She does not plan to have another colonoscopy    Dispo:  Ms. Castronova appears to be doing well clinically. No B symptoms. CBC with stable mild leukocytosis, thrombocytosis, and anemia. Normal iron and ferritin.   She continues to live a healthy, active lifestyle. Next mammo 2/24. She continues f/up with derm.   We reviewed her personal and family history of malignancies. She is interested to learn more about genetic testing, I referred her.  She knows to monitor for unintentional weight loss, night sweats, fever, or adenopathy. Return for lab and f/up in 6 months or sooner if needed.    Orders Placed This Encounter  Procedures  . Ambulatory referral to Genetics    Referral Priority:   Routine    Referral Type:   Consultation    Referral Reason:   Specialty Services Required    Number of Visits Requested:   1   All questions were answered. The patient knows to call the clinic with any problems, questions or concerns. No barriers to learning were detected. Total non-face-to-face time was 25 minutes for this encounter.      Alla Feeling, NP 09/26/20

## 2020-09-28 ENCOUNTER — Other Ambulatory Visit: Payer: Self-pay

## 2020-09-28 ENCOUNTER — Ambulatory Visit
Admission: RE | Admit: 2020-09-28 | Discharge: 2020-09-28 | Disposition: A | Discharge status: Home / Self Care | Payer: Medicare HMO | Source: Ambulatory Visit | Attending: Internal Medicine | Admitting: Internal Medicine

## 2020-09-28 DIAGNOSIS — Z1231 Encounter for screening mammogram for malignant neoplasm of breast: Secondary | ICD-10-CM

## 2020-09-28 IMAGING — MG MM DIGITAL SCREENING BILAT W/ TOMO AND CAD
8 series · 9 of 24 positions shown · non-contrast
Comparison: Previous exam(s).

CLINICAL DATA: Screening.

EXAM:
DIGITAL SCREENING BILATERAL MAMMOGRAM WITH TOMOSYNTHESIS AND CAD
TECHNIQUE: Bilateral screening digital craniocaudal and mediolateral oblique
mammograms were obtained. Bilateral screening digital breast
tomosynthesis was performed. The images were evaluated with
computer-aided detection.

[L MLO synth-2D]
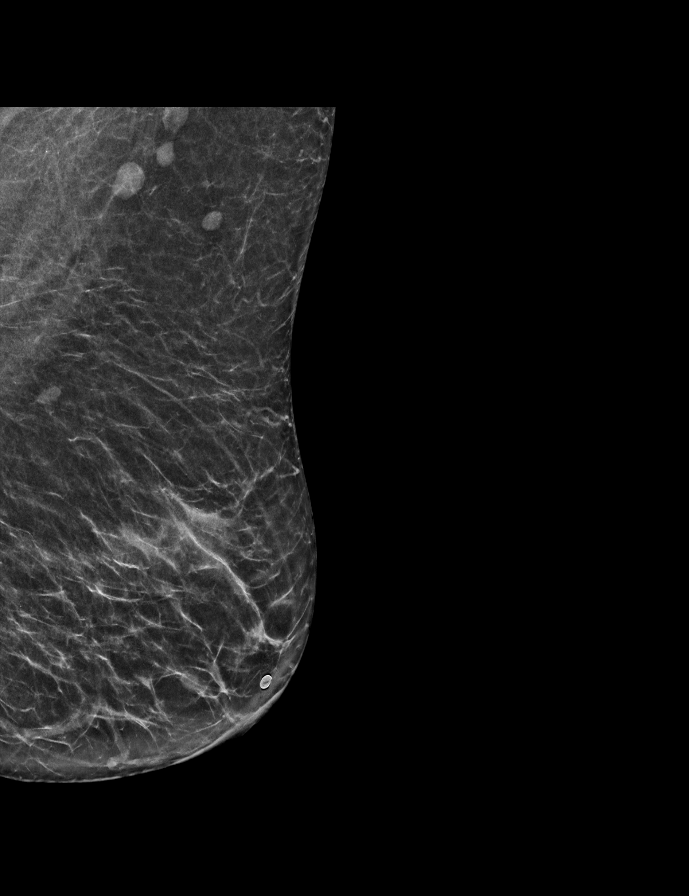

[R MLO synth-2D]
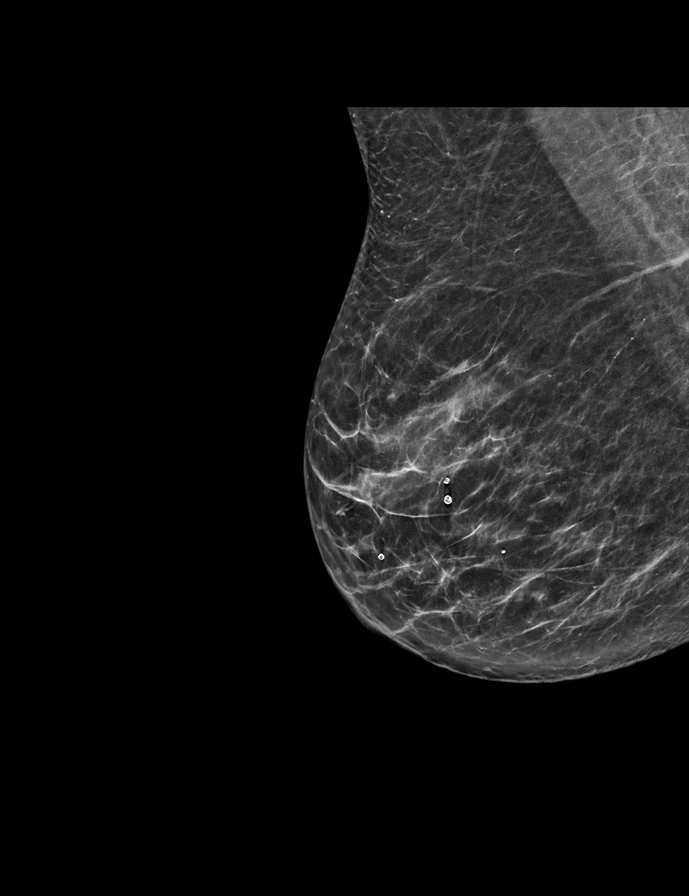

[L CC synth-2D]
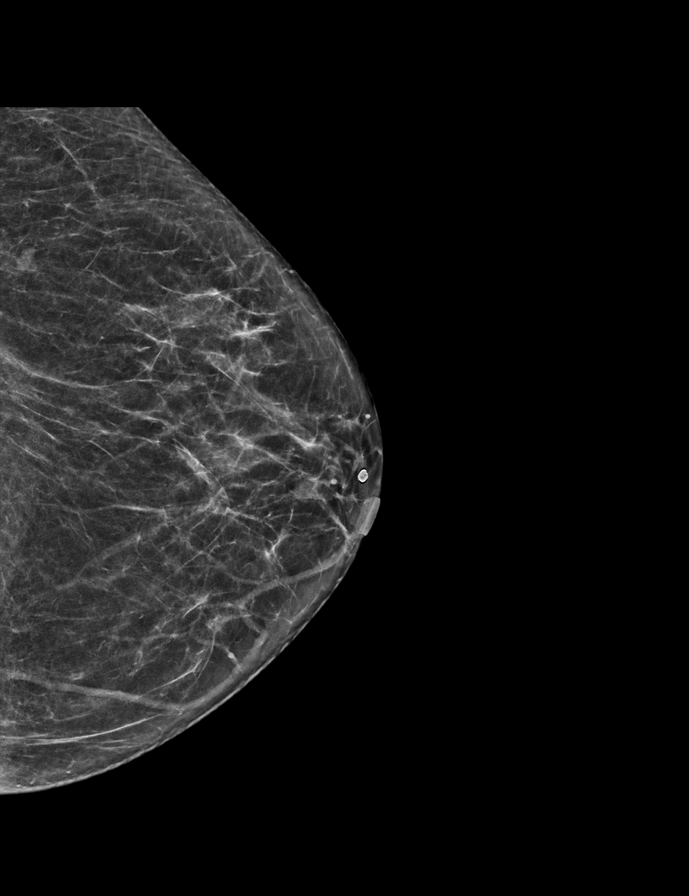

[R CC synth-2D]
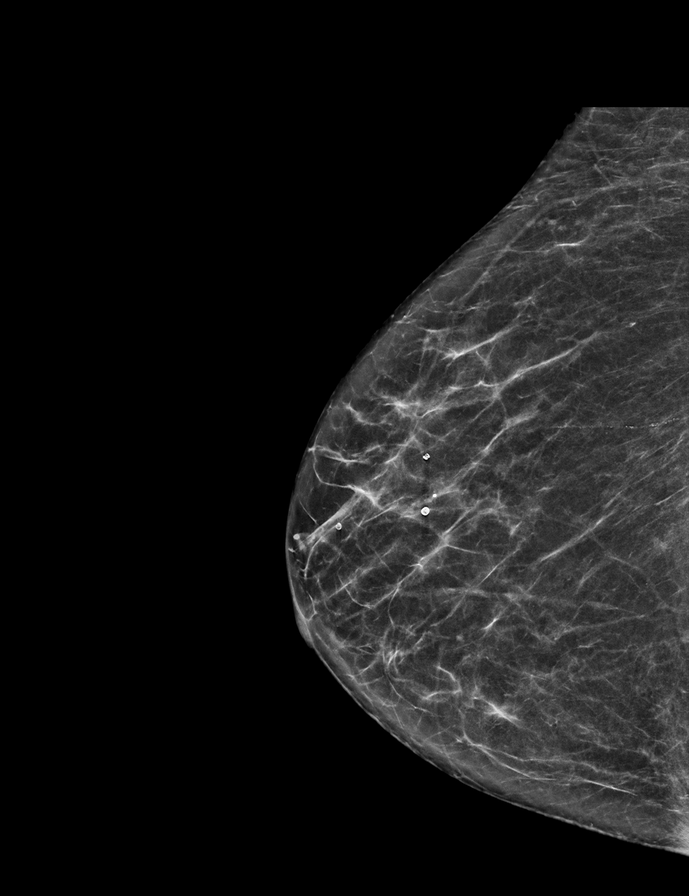

[R CC tomo · 2 of 56 frames shown]
[frame 19/56]
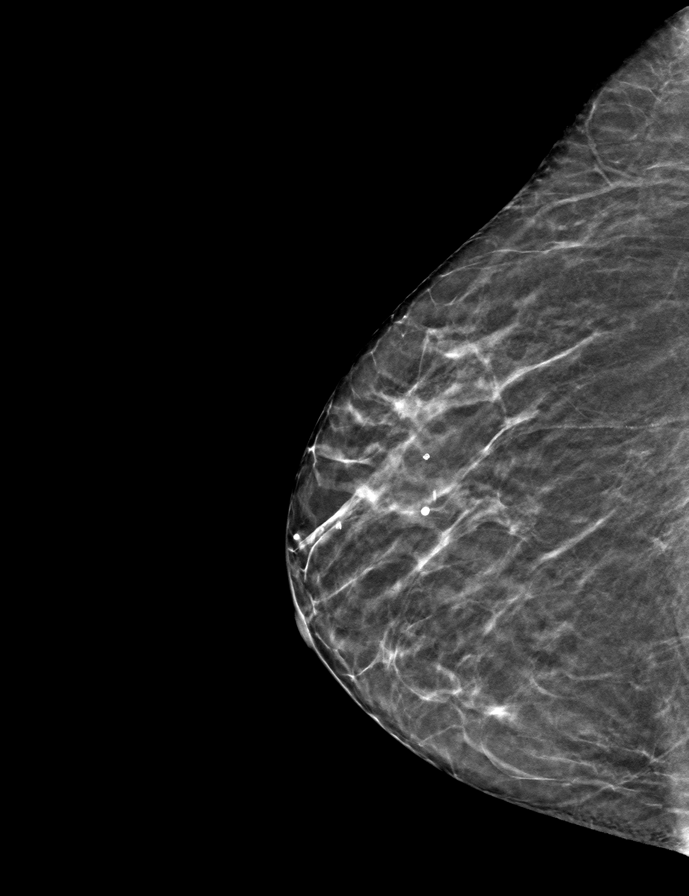
[frame 29/56]
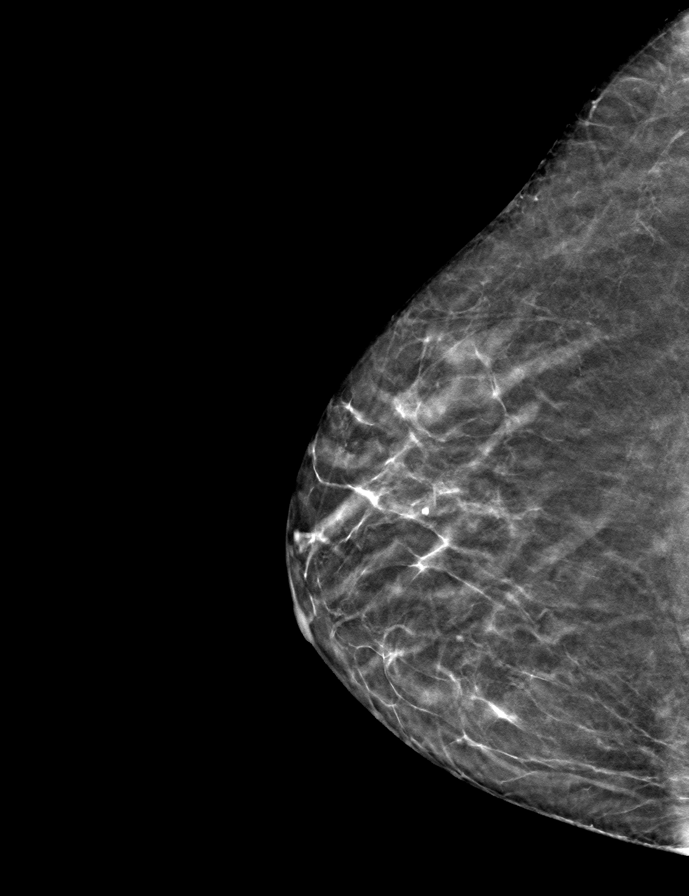

[L MLO tomo · tomo slice 29/57.0]
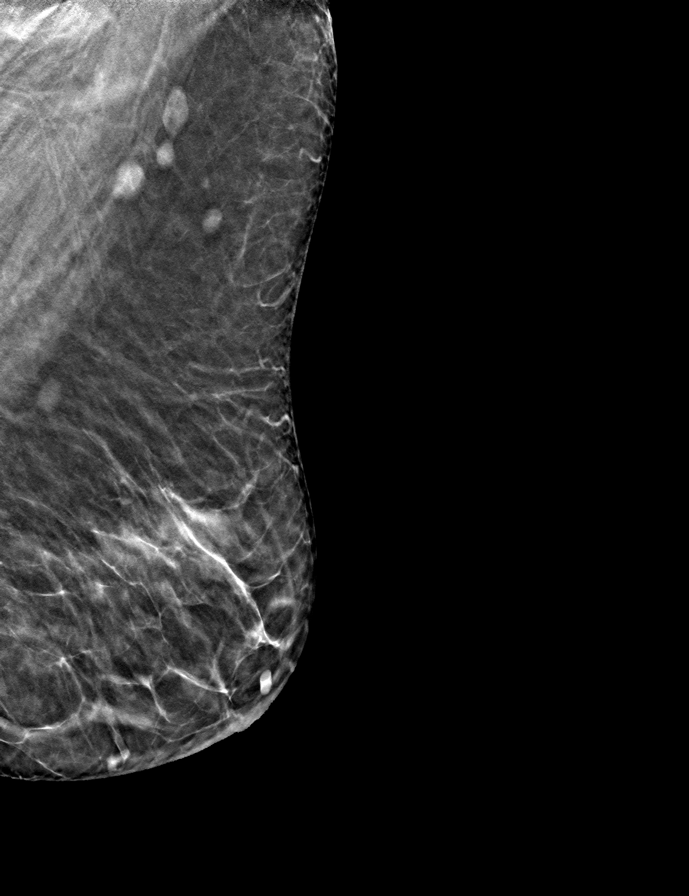

[R MLO tomo · tomo slice 31/60.0]
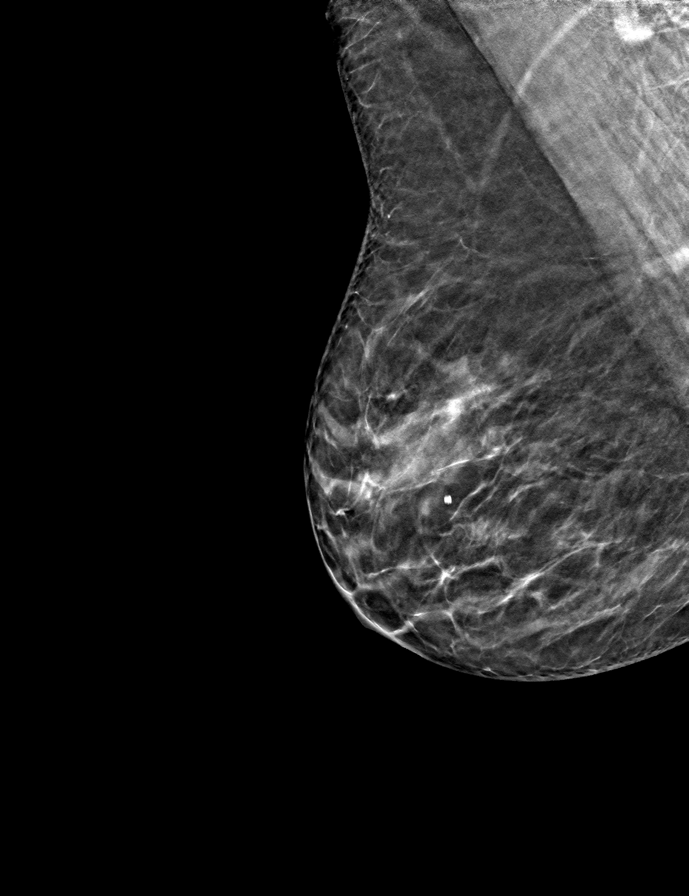

[L CC tomo · tomo slice 27/52.0]
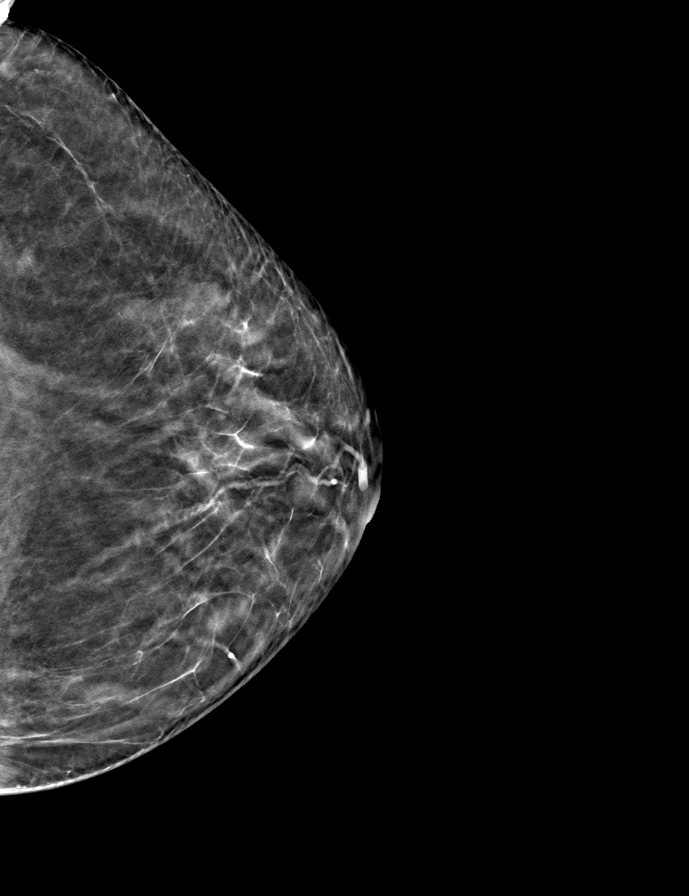

[9 of 24 positions shown; findings below may reference images not displayed]

ACR Breast Density Category b: There are scattered areas of
fibroglandular density.
FINDINGS: There are no findings suspicious for malignancy. The images were
evaluated with computer-aided detection.
IMPRESSION: No mammographic evidence of malignancy. A result letter of this
screening mammogram will be mailed directly to the patient.

RECOMMENDATION:
Screening mammogram in one year. (Code:[OD])

BI-RADS CATEGORY  1: Negative.

## 2020-10-02 ENCOUNTER — Inpatient Hospital Stay: Payer: Medicare HMO | Admitting: Nurse Practitioner

## 2020-10-02 ENCOUNTER — Inpatient Hospital Stay: Payer: Medicare HMO | Admitting: Genetic Counselor

## 2020-10-02 ENCOUNTER — Inpatient Hospital Stay: Payer: Medicare HMO

## 2021-02-28 ENCOUNTER — Telehealth: Payer: Self-pay | Admitting: Hematology

## 2021-02-28 NOTE — Telephone Encounter (Signed)
Left message with rescheduled upcoming appointment.

## 2021-03-08 DIAGNOSIS — Z0000 Encounter for general adult medical examination without abnormal findings: Secondary | ICD-10-CM | POA: Diagnosis not present

## 2021-03-15 ENCOUNTER — Other Ambulatory Visit: Payer: Self-pay | Admitting: Internal Medicine

## 2021-03-15 DIAGNOSIS — R748 Abnormal levels of other serum enzymes: Secondary | ICD-10-CM

## 2021-03-15 DIAGNOSIS — R7301 Impaired fasting glucose: Secondary | ICD-10-CM | POA: Diagnosis not present

## 2021-03-15 DIAGNOSIS — Z1212 Encounter for screening for malignant neoplasm of rectum: Secondary | ICD-10-CM | POA: Diagnosis not present

## 2021-03-15 DIAGNOSIS — D649 Anemia, unspecified: Secondary | ICD-10-CM | POA: Diagnosis not present

## 2021-03-15 DIAGNOSIS — C482 Malignant neoplasm of peritoneum, unspecified: Secondary | ICD-10-CM | POA: Diagnosis not present

## 2021-03-15 DIAGNOSIS — Z0000 Encounter for general adult medical examination without abnormal findings: Secondary | ICD-10-CM | POA: Diagnosis not present

## 2021-03-15 DIAGNOSIS — D75839 Thrombocytosis, unspecified: Secondary | ICD-10-CM | POA: Diagnosis not present

## 2021-03-19 ENCOUNTER — Other Ambulatory Visit: Payer: Self-pay | Admitting: Internal Medicine

## 2021-03-19 ENCOUNTER — Ambulatory Visit
Admission: RE | Admit: 2021-03-19 | Discharge: 2021-03-19 | Disposition: A | Discharge status: Home / Self Care | Payer: Medicare HMO | Source: Ambulatory Visit | Attending: Internal Medicine | Admitting: Internal Medicine

## 2021-03-19 DIAGNOSIS — D225 Melanocytic nevi of trunk: Secondary | ICD-10-CM | POA: Diagnosis not present

## 2021-03-19 DIAGNOSIS — C482 Malignant neoplasm of peritoneum, unspecified: Secondary | ICD-10-CM

## 2021-03-19 DIAGNOSIS — D1801 Hemangioma of skin and subcutaneous tissue: Secondary | ICD-10-CM | POA: Diagnosis not present

## 2021-03-19 DIAGNOSIS — L814 Other melanin hyperpigmentation: Secondary | ICD-10-CM | POA: Diagnosis not present

## 2021-03-19 DIAGNOSIS — L570 Actinic keratosis: Secondary | ICD-10-CM | POA: Diagnosis not present

## 2021-03-19 DIAGNOSIS — R748 Abnormal levels of other serum enzymes: Secondary | ICD-10-CM

## 2021-03-19 DIAGNOSIS — Z85828 Personal history of other malignant neoplasm of skin: Secondary | ICD-10-CM | POA: Diagnosis not present

## 2021-03-19 DIAGNOSIS — D485 Neoplasm of uncertain behavior of skin: Secondary | ICD-10-CM | POA: Diagnosis not present

## 2021-03-19 DIAGNOSIS — R945 Abnormal results of liver function studies: Secondary | ICD-10-CM | POA: Diagnosis not present

## 2021-03-19 DIAGNOSIS — L218 Other seborrheic dermatitis: Secondary | ICD-10-CM | POA: Diagnosis not present

## 2021-03-19 DIAGNOSIS — L821 Other seborrheic keratosis: Secondary | ICD-10-CM | POA: Diagnosis not present

## 2021-03-20 ENCOUNTER — Other Ambulatory Visit: Payer: Self-pay | Admitting: Internal Medicine

## 2021-03-20 DIAGNOSIS — N281 Cyst of kidney, acquired: Secondary | ICD-10-CM

## 2021-03-28 ENCOUNTER — Ambulatory Visit: Payer: Medicare HMO | Admitting: Nurse Practitioner

## 2021-03-28 ENCOUNTER — Other Ambulatory Visit: Payer: Medicare HMO

## 2021-04-01 NOTE — Progress Notes (Signed)
[place]   Telephone:([phone]) [phone] Fax:(336) [phone]   Clinic Follow up Note   Patient Care Team: Deland Pretty, MD as PCP - General (Internal Medicine) Nobie Putnam, MD (Hematology and Oncology) Fay Records, MD as Referring Physician (Obstetrics and Gynecology) Jodi Marble, MD (Otolaryngology) Truitt Merle, MD as Consulting Physician (Hematology) Alla Feeling, NP as Nurse Practitioner (Nurse Practitioner) 04/02/2021  CHIEF COMPLAINT: Follow up lymphocytosis and thrombocytosis   CURRENT THERAPY: Observation  INTERVAL HISTORY: Elaine [name] returns for follow up as scheduled. She was last seen in person 03/30/20 and virtually 09/26/20. She feels great. Continues to exercise and walk daily and do her own yard work.  She had chafing in her groin, resolving with feminine soap and topical cream.  Appetite is normal, denies unintentional weight loss, fever, night sweats, adenopathy, recent infection, bleeding, pain, or any new concerns.  He started eating meat again.  Denies NSAID use.  She is up-to-date on mammogram and vaccines/boosters.   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 Meriam Sprague.     SURGICAL HISTORY: Past Surgical History:  Procedure Laterality Date   ABDOMINAL HYSTERECTOMY  2006   COLONOSCOPY  2012   neg.    OOPHORECTOMY  2006   SPLENECTOMY  2006   TONSILLECTOMY     TOTAL ABDOMINAL HYSTERECTOMY W/ BILATERAL SALPINGOOPHORECTOMY  2006    I have reviewed the social history and family history with the patient and they are unchanged from previous note.  ALLERGIES:  is allergic to aleve [naproxen sodium], belladonna alkaloids, ciprofloxacin, other, oxycodone-acetaminophen, percocet [oxycodone-acetaminophen], anzemet [dolasetron], oxycodone, oxycontin [oxycodone hcl],  sulfamethoxazole-trimethoprim, and vicodin [hydrocodone-acetaminophen].  MEDICATIONS:  Current Outpatient Medications  Medication Sig Dispense Refill   calcium carbonate (OS-CAL) 600 MG TABS Take 600 mg by mouth 2 (two) times daily with a meal.     calcium carbonate (TUMS - DOSED IN MG ELEMENTAL CALCIUM) 500 MG chewable tablet Chew 1 tablet by mouth as needed for heartburn.     cholecalciferol (VITAMIN D) 1000 UNITS tablet Take 1,000 Units by mouth daily.     fish oil-omega-3 fatty acids 1000 MG capsule Take 4,800 mg by mouth daily.      levothyroxine (SYNTHROID, LEVOTHROID) 50 MCG tablet 50 mcg Daily.     Multiple Vitamins-Minerals (MULTIVITAMIN WITH MINERALS) tablet Take 1 tablet by mouth daily.     No current facility-administered medications for this visit.    PHYSICAL EXAMINATION: ECOG PERFORMANCE STATUS: 0 - Asymptomatic  Vitals:   04/02/21 0950  BP: (!) 161/80  Pulse: 76  Resp: 17  Temp: 98 F (36.7 C)  SpO2: 97%   Filed Weights   04/02/21 0950  Weight: 126 lb 11.2 oz (57.5 kg)    GENERAL:alert, no distress and comfortable SKIN: No rash to exposed skin EYES: sclera clear NECK: Without mass LYMPH:  no palpable cervical, supraclavicular, axillary, or inguinal lymphadenopathy LUNGS: clear with normal breathing effort HEART: regular rate & rhythm, no lower extremity edema ABDOMEN:abdomen soft, non-tender and normal bowel sounds NEURO: alert & oriented x 3 with fluent speech, no focal motor/sensory deficits Breast exam deferred  LABORATORY DATA:  I have reviewed the data as listed CBC Latest Ref Rng & Units 04/02/2021 09/25/2020 03/30/2020  WBC 4.0 - 10.5  K/uL 11.9(H) 14.4(H) 16.6(H)  Hemoglobin 12.0 - 15.0 g/dL 11.6(L) 11.9(L) 11.9(L)  Hematocrit 36.0 - 46.0 % 34.5(L) 35.2(L) 35.9(L)  Platelets 150 - 400 K/uL 449(H) 439(H) 453(H)      RADIOGRAPHIC STUDIES: I have personally reviewed the radiological images as listed and agreed with the findings in the  report. No results found.   ASSESSMENT & PLAN: Elaine [name] is a 87 y.o. female with    1. Leukocytosis, likely reactive  -She has chronic mild leukocytosis with lymphocytosis and intermittent thrombocytosis since 2013. Jak2 05/04/12 was negative.  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   -Patient has had mild intermittent lymphocytosis for over 7 years. No neutropenia. She has mild anemia Hgb 11.6 - 12. No B symptoms. This is felt to be reactive or related to previous splenectomy, unlikely LGL leukemia.  -03/08/2021 labs showed platelet 557, Hgb 11.2, WBC 12.0, absolute lymphocytes 5.2 -Today she is clinically doing well, no adenopathy or B symptoms.  CBC closer to baseline -Iron/ferritin, B12, and folate are pending -Continue observation   2. Skin cancer  -she reports a non-melanoma skin cancer removed from her head  -continue f/u with derm    3. Extraovarian adenocarcinoma, 2006 -S/p TAH/BSO with debulking and splenectomy in 2006 with adjuvant taxol/carboplatin x6 cycles, completed in 2007; s/p salvage chemotherapy taxotere/carboplatin for pulmonary recurrence in 2009, NED -Followed by Dr. Rhodia Albright at [facility] and discharged from f/u in 2017.    4. Health maintenance  -last mammo 09/28/20 was negative -She does not plan to have another colonoscopy   Disposition: Elaine [name] is clinically doing well with very good performance status.  Labs 03/08/2021 at PCP showed worsening thrombocytosis with platelet 557, and anemia Hgb 11.2. CBC today in our lab is closer to baseline with platelet 449, Hg 11.6, WBC 11.9.  No adenopathy or B symptoms.   We will follow-up on the pending ferritin/iron, folate, B12, and MMA from today.  Continue observation.  Repeat lab in 6 months, follow-up in 12 months, or sooner if needed.  She knows to call if she develops B symptoms,  adenopathy, or other new concerns.  The case was discussed with Dr. Burr Medico.  All questions were answered. The patient knows to call the clinic with any problems, questions or concerns. No barriers to learning were detected.     Alla Feeling, NP 04/02/21

## 2021-04-02 ENCOUNTER — Inpatient Hospital Stay: Payer: Medicare HMO | Attending: Nurse Practitioner

## 2021-04-02 ENCOUNTER — Encounter: Payer: Self-pay | Admitting: Nurse Practitioner

## 2021-04-02 ENCOUNTER — Other Ambulatory Visit: Payer: Self-pay | Admitting: Nurse Practitioner

## 2021-04-02 ENCOUNTER — Other Ambulatory Visit: Payer: Self-pay

## 2021-04-02 ENCOUNTER — Inpatient Hospital Stay: Payer: Medicare HMO | Admitting: Nurse Practitioner

## 2021-04-02 VITALS — BP 161/80 | HR 76 | Temp 98.0°F | Resp 17 | Wt 126.7 lb

## 2021-04-02 DIAGNOSIS — D72829 Elevated white blood cell count, unspecified: Secondary | ICD-10-CM

## 2021-04-02 DIAGNOSIS — D72820 Lymphocytosis (symptomatic): Secondary | ICD-10-CM | POA: Insufficient documentation

## 2021-04-02 DIAGNOSIS — C4440 Unspecified malignant neoplasm of skin of scalp and neck: Secondary | ICD-10-CM | POA: Insufficient documentation

## 2021-04-02 DIAGNOSIS — Z90722 Acquired absence of ovaries, bilateral: Secondary | ICD-10-CM | POA: Insufficient documentation

## 2021-04-02 DIAGNOSIS — C482 Malignant neoplasm of peritoneum, unspecified: Secondary | ICD-10-CM

## 2021-04-02 DIAGNOSIS — Z886 Allergy status to analgesic agent status: Secondary | ICD-10-CM | POA: Diagnosis not present

## 2021-04-02 DIAGNOSIS — Z885 Allergy status to narcotic agent status: Secondary | ICD-10-CM | POA: Diagnosis not present

## 2021-04-02 DIAGNOSIS — D649 Anemia, unspecified: Secondary | ICD-10-CM | POA: Insufficient documentation

## 2021-04-02 DIAGNOSIS — Z9081 Acquired absence of spleen: Secondary | ICD-10-CM | POA: Diagnosis not present

## 2021-04-02 DIAGNOSIS — D75839 Thrombocytosis, unspecified: Secondary | ICD-10-CM | POA: Diagnosis not present

## 2021-04-02 DIAGNOSIS — Z881 Allergy status to other antibiotic agents status: Secondary | ICD-10-CM | POA: Diagnosis not present

## 2021-04-02 DIAGNOSIS — Z8543 Personal history of malignant neoplasm of ovary: Secondary | ICD-10-CM | POA: Insufficient documentation

## 2021-04-02 LAB — CBC WITH DIFFERENTIAL (CANCER CENTER ONLY)
Abs Immature Granulocytes: 0.07 10*3/uL (ref 0.00–0.07)
Basophils Absolute: 0.1 10*3/uL (ref 0.0–0.1)
Basophils Relative: 0 %
Eosinophils Absolute: 0.1 10*3/uL (ref 0.0–0.5)
Eosinophils Relative: 1 %
HCT: 34.5 % — ABNORMAL LOW (ref 36.0–46.0)
Hemoglobin: 11.6 g/dL — ABNORMAL LOW (ref 12.0–15.0)
Immature Granulocytes: 1 %
Lymphocytes Relative: 34 %
Lymphs Abs: 4.1 10*3/uL — ABNORMAL HIGH (ref 0.7–4.0)
MCH: 30.5 pg (ref 26.0–34.0)
MCHC: 33.6 g/dL (ref 30.0–36.0)
MCV: 90.8 fL (ref 80.0–100.0)
Monocytes Absolute: 0.6 10*3/uL (ref 0.1–1.0)
Monocytes Relative: 5 %
Neutro Abs: 7 10*3/uL (ref 1.7–7.7)
Neutrophils Relative %: 59 %
Platelet Count: 449 10*3/uL — ABNORMAL HIGH (ref 150–400)
RBC: 3.8 MIL/uL — ABNORMAL LOW (ref 3.87–5.11)
RDW: 15.3 % (ref 11.5–15.5)
WBC Count: 11.9 10*3/uL — ABNORMAL HIGH (ref 4.0–10.5)
nRBC: 0 % (ref 0.0–0.2)

## 2021-04-02 LAB — IRON AND TIBC
Iron: 98 ug/dL (ref 41–142)
Saturation Ratios: 34 % (ref 21–57)
TIBC: 288 ug/dL (ref 236–444)
UIBC: 189 ug/dL (ref 120–384)

## 2021-04-02 LAB — VITAMIN B12: Vitamin B-12: 174 pg/mL — ABNORMAL LOW (ref 180–914)

## 2021-04-02 LAB — FERRITIN: Ferritin: 109 ng/mL (ref 11–307)

## 2021-04-03 ENCOUNTER — Telehealth: Payer: Self-pay | Admitting: Hematology

## 2021-04-03 LAB — FOLATE RBC
Folate, Hemolysate: 396 ng/mL
Folate, RBC: 1097 ng/mL (ref >498)
Hematocrit: 36.1 % (ref 34.0–46.6)

## 2021-04-03 NOTE — Telephone Encounter (Signed)
Scheduled follow-up appointments per 8/29 los. Patient is aware. Mailed calendar.

## 2021-04-04 ENCOUNTER — Other Ambulatory Visit: Payer: Self-pay | Admitting: Nurse Practitioner

## 2021-04-04 DIAGNOSIS — D519 Vitamin B12 deficiency anemia, unspecified: Secondary | ICD-10-CM

## 2021-04-04 LAB — METHYLMALONIC ACID, SERUM: Methylmalonic Acid, Quantitative: 304 nmol/L (ref 0–378)

## 2021-04-05 ENCOUNTER — Telehealth: Payer: Self-pay | Admitting: Nurse Practitioner

## 2021-04-05 NOTE — Telephone Encounter (Signed)
Scheduled per sch msg. Called and left msg  

## 2021-04-10 ENCOUNTER — Ambulatory Visit
Admission: RE | Admit: 2021-04-10 | Discharge: 2021-04-10 | Disposition: A | Discharge status: Home / Self Care | Payer: Medicare HMO | Source: Ambulatory Visit | Attending: Internal Medicine | Admitting: Internal Medicine

## 2021-04-10 ENCOUNTER — Other Ambulatory Visit: Payer: Self-pay

## 2021-04-10 DIAGNOSIS — N281 Cyst of kidney, acquired: Secondary | ICD-10-CM | POA: Diagnosis not present

## 2021-04-10 DIAGNOSIS — K8689 Other specified diseases of pancreas: Secondary | ICD-10-CM | POA: Diagnosis not present

## 2021-04-10 DIAGNOSIS — I700 Atherosclerosis of aorta: Secondary | ICD-10-CM | POA: Diagnosis not present

## 2021-04-10 IMAGING — MR MR ABDOMEN WO/W CM
16 of 17 series · 45 of 48 positions shown · IV contrast (multihance)
Comparison: Abdominal ultrasound, [DATE]

CLINICAL DATA: Characterize left renal lesion incidentally
identified by prior ultrasound

EXAM:
MRI ABDOMEN WITHOUT AND WITH CONTRAST
TECHNIQUE: Multiplanar multisequence MR imaging of the abdomen was performed
both before and after the administration of intravenous contrast.
CONTRAST:  11mL MULTIHANCE GADOBENATE DIMEGLUMINE 529 MG/ML IV SOLN

[Series 4: T2 · coronal · 5.0mm · 0.78mm/px · 2 of 18 slices shown (1 of 3)]
[im 1/18]
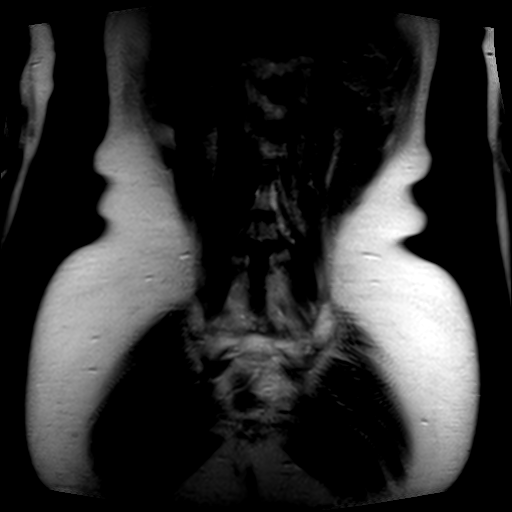
[im 18/18]
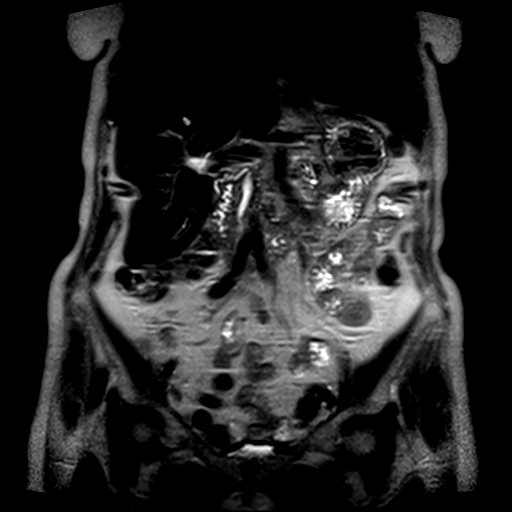

[Series 5: T2 · axial · 5.0mm · 0.66mm/px · z∈[-29,+103]mm · 2 of 23 slices shown (2 of 3)]
[im 1/23]
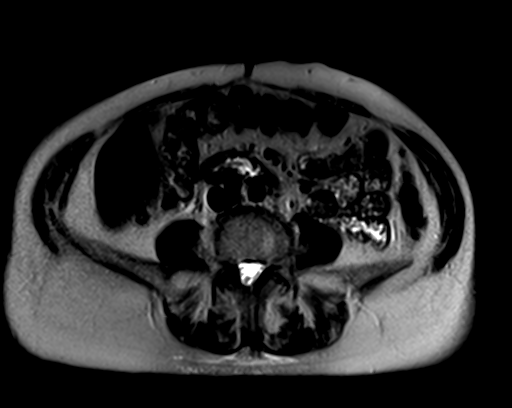
[im 23/23]
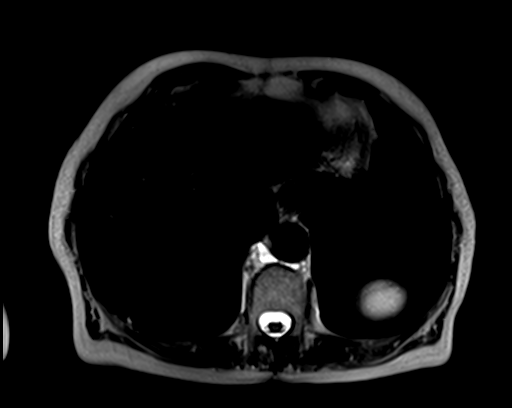

[Series 6: ep2d_diff_b50_500_800_p2_trig · axial · 6.0mm · 1.98mm/px · z∈[-12,+144]mm · 5 of 63 slices shown]
[im 1/63]
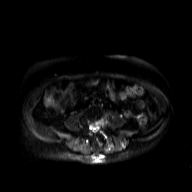
[im 16/63]
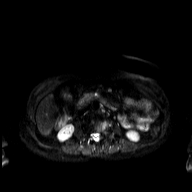
[im 32/63]
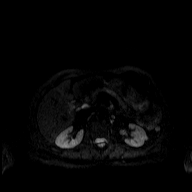
[im 47/63]
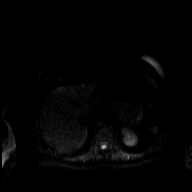
[im 63/63]
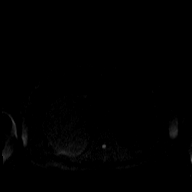

[Series 7: ep2d_diff_b50_500_800_p2_trig_adc · axial · 6.0mm · 1.98mm/px · z∈[-12,+144]mm · 2 of 21 slices shown]
[im 1/21]
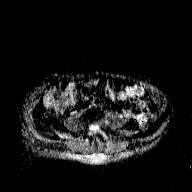
[im 21/21]
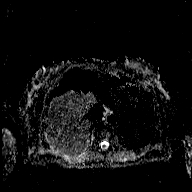

[Series 8: T2 · axial · 5.0mm · 1.33mm/px · z∈[-3,+141]mm · 2 of 25 slices shown (3 of 3)]
[im 1/25]
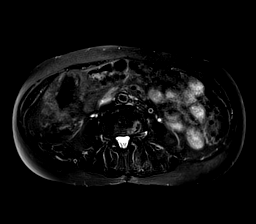
[im 25/25]
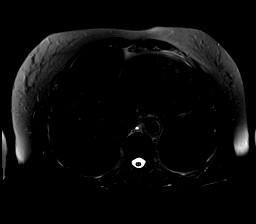

[Series 9: bSSFP · axial · 4.0mm · 0.70mm/px · z∈[-27,+121]mm · 2 of 38 slices shown]
[im 1/38]
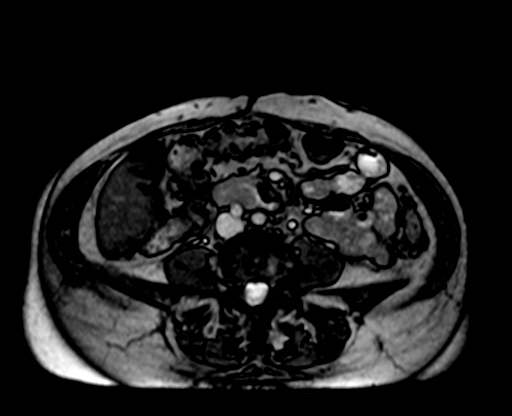
[im 38/38]
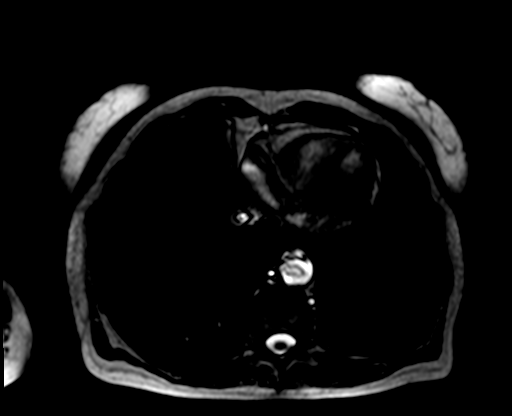

[Series 10: T1 · axial · 5.0mm · 0.66mm/px · z∈[-22,+116]mm · 3 of 48 slices shown]
[im 1/48]
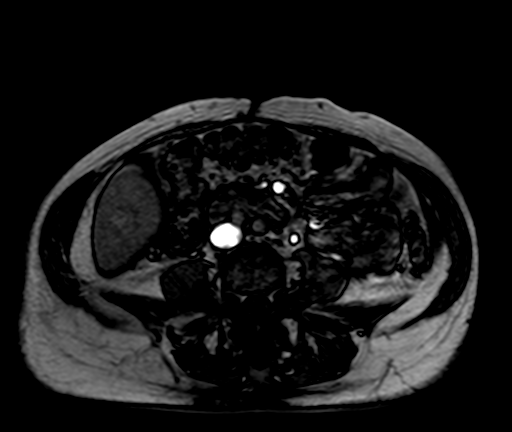
[im 24/48]
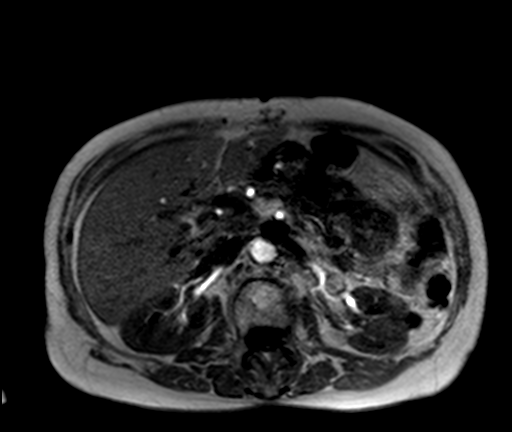
[im 48/48]
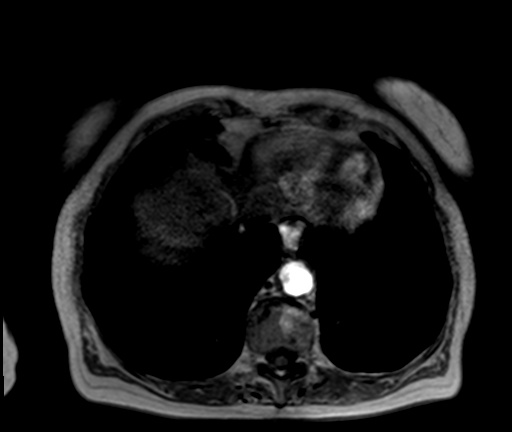

[Series 11: T1 dynamic · axial · non-contrast · 2.3mm · 1.33mm/px · z∈[-24,+111]mm · 3 of 60 slices shown]
[im 1/60]
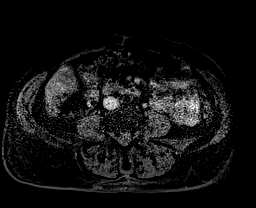
[im 30/60]
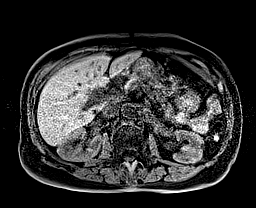
[im 60/60]
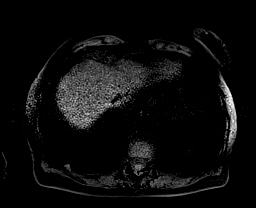

[Series 12: post 25 sec · axial · 2.3mm · 1.33mm/px · z∈[-24,+111]mm · 3 of 60 slices shown]
[im 1/60]
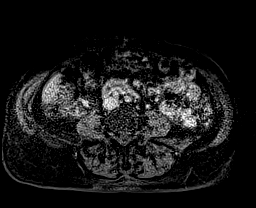
[im 30/60]
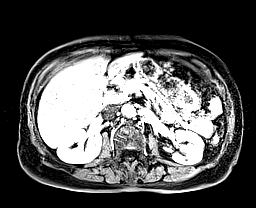
[im 60/60]
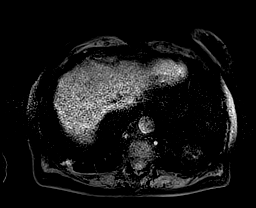

[Series 13: post 25 sec_sub · axial · 2.3mm · 1.33mm/px · z∈[-24,+111]mm · 3 of 60 slices shown]
[im 1/60]
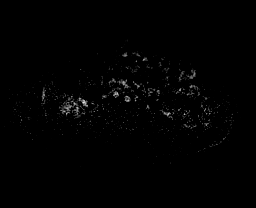
[im 30/60]
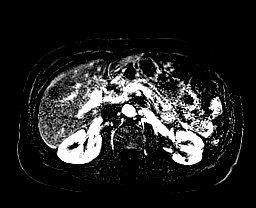
[im 60/60]
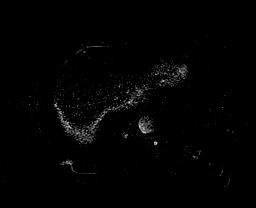

[Series 14: post 45 sec · axial · 2.3mm · 1.33mm/px · z∈[-24,+111]mm · 3 of 60 slices shown]
[im 1/60]
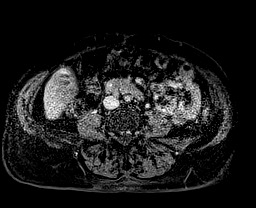
[im 30/60]
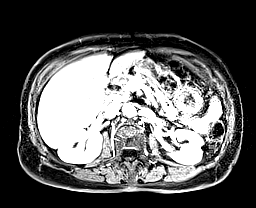
[im 60/60]
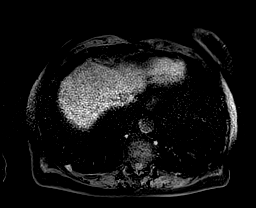

[Series 15: post 45 sec_sub · axial · 2.3mm · 1.33mm/px · z∈[-24,+111]mm · 3 of 60 slices shown]
[im 1/60]
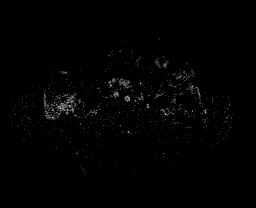
[im 30/60]
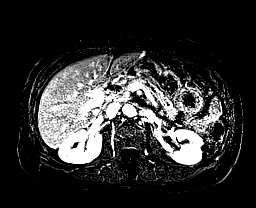
[im 60/60]
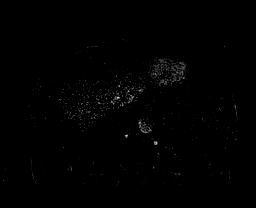

[Series 16: post 90 sec · axial · 2.3mm · 1.33mm/px · z∈[-24,+111]mm · 3 of 60 slices shown]
[im 1/60]
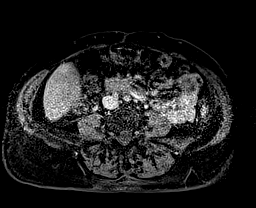
[im 30/60]
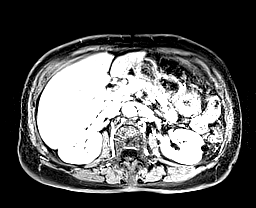
[im 60/60]
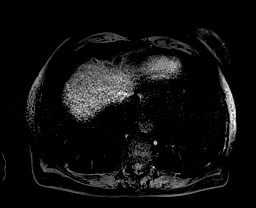

[Series 17: post 90 sec_sub · axial · 2.3mm · 1.33mm/px · z∈[-24,+111]mm · 3 of 60 slices shown]
[im 1/60]
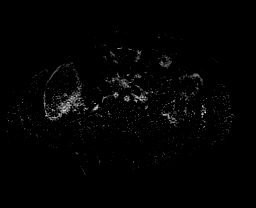
[im 30/60]
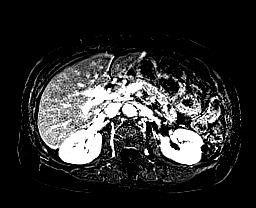
[im 60/60]
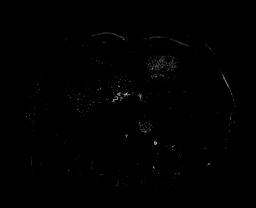

[Series 18: T1 dynamic post-contrast · coronal · 2.6mm · 0.78mm/px · 3 of 52 slices shown]
[im 1/52]
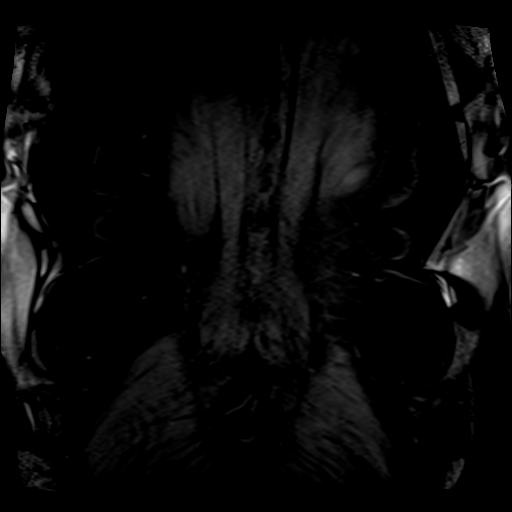
[im 26/52]
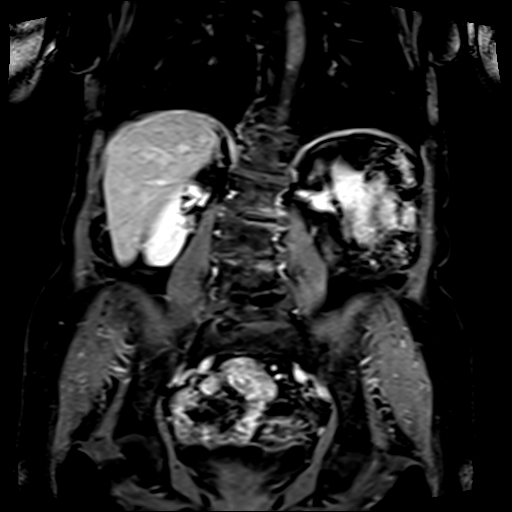
[im 52/52]
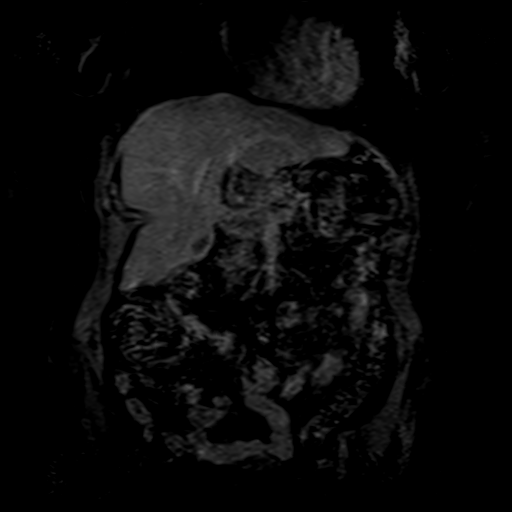

[Series 19: post axial 3+ · axial · 2.3mm · 1.33mm/px · z∈[-24,+111]mm · 3 of 60 slices shown]
[im 1/60]
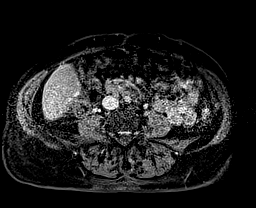
[im 30/60]
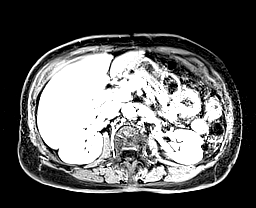
[im 60/60]
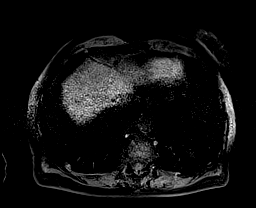

[45 of 48 positions shown; findings below may reference images not displayed]

FINDINGS: Lower chest: No acute findings.

Hepatobiliary: No mass or other parenchymal abnormality identified.
No biliary ductal dilatation.

Pancreas: Mild atrophy of the pancreatic body and tail. There is
diffuse dilatation of the pancreatic duct distal to the pancreatic
neck, at which level there appears to be a filling defect of the
main pancreatic duct measuring 0.6 cm (series 8, image 14). Maximum
caliber of the duct is 0.6 cm.

Spleen:  Within normal limits in size and appearance.

Adrenals/Urinary Tract: No solid masses identified. There is a
definitively benign, exophytic cyst of the lateral midportion of the
left kidney measuring 1.1 cm (series 8, image 13). No evidence of
solid component or suspicious contrast enhancement. There are
multiple additional subcentimeter benign cysts bilaterally. No
evidence of hydronephrosis.

Stomach/Bowel: Visualized portions within the abdomen are
unremarkable.

Vascular/Lymphatic: No pathologically enlarged lymph nodes
identified. No abdominal aortic aneurysm demonstrated. Aortic
atherosclerosis.

Other:  None.

Musculoskeletal: No suspicious bone lesions identified.
IMPRESSION: 1. There is a definitively benign, exophytic cyst of the lateral
midportion of the left kidney measuring 1.1 cm. There are multiple
additional subcentimeter benign cysts bilaterally. No evidence of
solid component or suspicious contrast enhancement. No further
follow-up or characterization is required for these benign findings.
2. There is diffuse dilatation of the pancreatic duct distal to the
pancreatic neck, at which there appears to be a rounded filling
defect of the main pancreatic duct measuring 0.6 cm. Maximum caliber
of the duct is 0.6 cm. Findings are suspicious for a small,
chronically obstructing ductal calculus or alternately a small
intraluminal neoplasm. Consider endoscopic ultrasound for further
evaluation.

## 2021-04-10 MED ORDER — GADOBENATE DIMEGLUMINE 529 MG/ML IV SOLN
11.0000 mL | Freq: Once | INTRAVENOUS | Status: AC | PRN
  Administered 2021-04-10: 11 mL via INTRAVENOUS

## 2021-04-11 ENCOUNTER — Ambulatory Visit: Payer: Medicare HMO

## 2021-04-11 DIAGNOSIS — R748 Abnormal levels of other serum enzymes: Secondary | ICD-10-CM | POA: Diagnosis not present

## 2021-04-11 DIAGNOSIS — D649 Anemia, unspecified: Secondary | ICD-10-CM | POA: Diagnosis not present

## 2021-04-11 DIAGNOSIS — R7301 Impaired fasting glucose: Secondary | ICD-10-CM | POA: Diagnosis not present

## 2021-04-12 DIAGNOSIS — R945 Abnormal results of liver function studies: Secondary | ICD-10-CM | POA: Diagnosis not present

## 2021-04-12 DIAGNOSIS — R935 Abnormal findings on diagnostic imaging of other abdominal regions, including retroperitoneum: Secondary | ICD-10-CM | POA: Diagnosis not present

## 2021-04-13 DIAGNOSIS — Z23 Encounter for immunization: Secondary | ICD-10-CM | POA: Diagnosis not present

## 2021-04-17 DIAGNOSIS — R7303 Prediabetes: Secondary | ICD-10-CM | POA: Diagnosis not present

## 2021-04-17 DIAGNOSIS — R748 Abnormal levels of other serum enzymes: Secondary | ICD-10-CM | POA: Diagnosis not present

## 2021-04-17 DIAGNOSIS — R935 Abnormal findings on diagnostic imaging of other abdominal regions, including retroperitoneum: Secondary | ICD-10-CM | POA: Diagnosis not present

## 2021-04-17 DIAGNOSIS — D72829 Elevated white blood cell count, unspecified: Secondary | ICD-10-CM | POA: Diagnosis not present

## 2021-04-17 DIAGNOSIS — D649 Anemia, unspecified: Secondary | ICD-10-CM | POA: Diagnosis not present

## 2021-04-18 ENCOUNTER — Other Ambulatory Visit: Payer: Self-pay

## 2021-04-18 ENCOUNTER — Inpatient Hospital Stay: Payer: Medicare HMO | Attending: Nurse Practitioner

## 2021-04-18 DIAGNOSIS — E538 Deficiency of other specified B group vitamins: Secondary | ICD-10-CM | POA: Diagnosis not present

## 2021-04-18 DIAGNOSIS — D519 Vitamin B12 deficiency anemia, unspecified: Secondary | ICD-10-CM

## 2021-04-18 MED ORDER — CYANOCOBALAMIN 1000 MCG/ML IJ SOLN
1000.0000 ug | Freq: Once | INTRAMUSCULAR | Status: AC
  Administered 2021-04-18: 1000 ug via INTRAMUSCULAR
  Filled 2021-04-18: qty 1

## 2021-04-18 NOTE — Patient Instructions (Signed)

## 2021-04-25 ENCOUNTER — Inpatient Hospital Stay: Payer: Medicare HMO

## 2021-04-25 ENCOUNTER — Other Ambulatory Visit: Payer: Self-pay

## 2021-04-25 DIAGNOSIS — E538 Deficiency of other specified B group vitamins: Secondary | ICD-10-CM | POA: Diagnosis not present

## 2021-04-25 DIAGNOSIS — D519 Vitamin B12 deficiency anemia, unspecified: Secondary | ICD-10-CM

## 2021-04-25 MED ORDER — CYANOCOBALAMIN 1000 MCG/ML IJ SOLN
1000.0000 ug | Freq: Once | INTRAMUSCULAR | Status: AC
Start: 2021-04-25 — End: 2021-04-25
  Administered 2021-04-25: 1000 ug via INTRAMUSCULAR
  Filled 2021-04-25: qty 1

## 2021-04-25 NOTE — Patient Instructions (Signed)

## 2021-05-02 ENCOUNTER — Other Ambulatory Visit: Payer: Self-pay

## 2021-05-02 ENCOUNTER — Telehealth: Payer: Self-pay | Admitting: Hematology

## 2021-05-02 ENCOUNTER — Inpatient Hospital Stay: Payer: Medicare HMO

## 2021-05-02 DIAGNOSIS — E538 Deficiency of other specified B group vitamins: Secondary | ICD-10-CM | POA: Diagnosis not present

## 2021-05-02 DIAGNOSIS — D519 Vitamin B12 deficiency anemia, unspecified: Secondary | ICD-10-CM

## 2021-05-02 MED ORDER — CYANOCOBALAMIN 1000 MCG/ML IJ SOLN
1000.0000 ug | Freq: Once | INTRAMUSCULAR | Status: AC
  Administered 2021-05-02: 1000 ug via INTRAMUSCULAR
  Filled 2021-05-02: qty 1

## 2021-05-02 NOTE — Telephone Encounter (Signed)
Patient's recent MRI of abdomen was reviewed in GI tumor board this morning, we recommend GI consultation and possible EUS.  I called patient, she has recently seen GI Dr. Watt Climes regarding her abnormal MRI findings, Dr. Watt Climes recommended follow-up scan in 3 months.  Patient is feeding well, no complaints.   Truitt Merle  05/02/2021

## 2021-05-02 NOTE — Patient Instructions (Signed)

## 2021-05-02 NOTE — Progress Notes (Signed)
The proposed treatment discussed in conference is for discussion purpose only and is not a binding recommendation.  The patients have not been physically examined, or presented with their treatment options.  Therefore, final treatment plans cannot be decided.  

## 2021-05-16 ENCOUNTER — Inpatient Hospital Stay: Payer: Medicare HMO | Attending: Nurse Practitioner

## 2021-05-16 ENCOUNTER — Other Ambulatory Visit: Payer: Self-pay

## 2021-05-16 DIAGNOSIS — D519 Vitamin B12 deficiency anemia, unspecified: Secondary | ICD-10-CM

## 2021-05-16 DIAGNOSIS — E538 Deficiency of other specified B group vitamins: Secondary | ICD-10-CM | POA: Diagnosis not present

## 2021-05-16 MED ORDER — CYANOCOBALAMIN 1000 MCG/ML IJ SOLN
1000.0000 ug | Freq: Once | INTRAMUSCULAR | Status: AC
  Administered 2021-05-16: 1000 ug via INTRAMUSCULAR
  Filled 2021-05-16: qty 1

## 2021-05-30 ENCOUNTER — Inpatient Hospital Stay: Payer: Medicare HMO

## 2021-06-27 ENCOUNTER — Inpatient Hospital Stay: Payer: Medicare HMO

## 2021-07-25 ENCOUNTER — Inpatient Hospital Stay: Payer: Medicare HMO | Attending: Nurse Practitioner

## 2021-07-25 ENCOUNTER — Other Ambulatory Visit: Payer: Self-pay

## 2021-07-25 DIAGNOSIS — D519 Vitamin B12 deficiency anemia, unspecified: Secondary | ICD-10-CM

## 2021-07-25 DIAGNOSIS — R748 Abnormal levels of other serum enzymes: Secondary | ICD-10-CM | POA: Diagnosis not present

## 2021-07-25 DIAGNOSIS — E538 Deficiency of other specified B group vitamins: Secondary | ICD-10-CM | POA: Diagnosis not present

## 2021-07-25 DIAGNOSIS — D72829 Elevated white blood cell count, unspecified: Secondary | ICD-10-CM | POA: Diagnosis not present

## 2021-07-25 DIAGNOSIS — R7303 Prediabetes: Secondary | ICD-10-CM | POA: Diagnosis not present

## 2021-07-25 MED ORDER — CYANOCOBALAMIN 1000 MCG/ML IJ SOLN
1000.0000 ug | Freq: Once | INTRAMUSCULAR | Status: AC
  Administered 2021-07-25: 15:00:00 1000 ug via INTRAMUSCULAR
  Filled 2021-07-25: qty 1

## 2021-08-01 ENCOUNTER — Other Ambulatory Visit: Payer: Self-pay | Admitting: Internal Medicine

## 2021-08-01 DIAGNOSIS — R935 Abnormal findings on diagnostic imaging of other abdominal regions, including retroperitoneum: Secondary | ICD-10-CM

## 2021-08-09 DIAGNOSIS — K8689 Other specified diseases of pancreas: Secondary | ICD-10-CM | POA: Diagnosis not present

## 2021-08-09 DIAGNOSIS — D72829 Elevated white blood cell count, unspecified: Secondary | ICD-10-CM | POA: Diagnosis not present

## 2021-08-09 DIAGNOSIS — R7303 Prediabetes: Secondary | ICD-10-CM | POA: Diagnosis not present

## 2021-08-09 DIAGNOSIS — R748 Abnormal levels of other serum enzymes: Secondary | ICD-10-CM | POA: Diagnosis not present

## 2021-08-09 DIAGNOSIS — Q8901 Asplenia (congenital): Secondary | ICD-10-CM | POA: Diagnosis not present

## 2021-08-22 ENCOUNTER — Other Ambulatory Visit: Payer: Self-pay

## 2021-08-22 ENCOUNTER — Inpatient Hospital Stay: Payer: Medicare HMO | Attending: Nurse Practitioner

## 2021-08-22 DIAGNOSIS — E538 Deficiency of other specified B group vitamins: Secondary | ICD-10-CM | POA: Diagnosis not present

## 2021-08-22 DIAGNOSIS — D519 Vitamin B12 deficiency anemia, unspecified: Secondary | ICD-10-CM

## 2021-08-22 MED ORDER — CYANOCOBALAMIN 1000 MCG/ML IJ SOLN
1000.0000 ug | Freq: Once | INTRAMUSCULAR | Status: AC
  Administered 2021-08-22: 1000 ug via INTRAMUSCULAR
  Filled 2021-08-22: qty 1

## 2021-08-23 ENCOUNTER — Encounter: Payer: Self-pay | Admitting: Nurse Practitioner

## 2021-08-23 ENCOUNTER — Ambulatory Visit
Admission: RE | Admit: 2021-08-23 | Discharge: 2021-08-23 | Disposition: A | Discharge status: Home / Self Care | Payer: Medicare HMO | Source: Ambulatory Visit | Attending: Internal Medicine | Admitting: Internal Medicine

## 2021-08-23 DIAGNOSIS — I700 Atherosclerosis of aorta: Secondary | ICD-10-CM | POA: Diagnosis not present

## 2021-08-23 DIAGNOSIS — K8689 Other specified diseases of pancreas: Secondary | ICD-10-CM | POA: Diagnosis not present

## 2021-08-23 DIAGNOSIS — R935 Abnormal findings on diagnostic imaging of other abdominal regions, including retroperitoneum: Secondary | ICD-10-CM

## 2021-08-23 DIAGNOSIS — K7689 Other specified diseases of liver: Secondary | ICD-10-CM | POA: Diagnosis not present

## 2021-08-23 DIAGNOSIS — N281 Cyst of kidney, acquired: Secondary | ICD-10-CM | POA: Diagnosis not present

## 2021-08-23 IMAGING — MR MR ABDOMEN WO/W CM
13 of 20 series · 26 of 48 positions shown · IV contrast (11ml Multihance)
Comparison: MRI [DATE]

CLINICAL DATA: Follow-up pancreatic lesion.

EXAM:
MRI ABDOMEN WITHOUT AND WITH CONTRAST
TECHNIQUE: Multiplanar multisequence MR imaging of the abdomen was performed
both before and after the administration of intravenous contrast.
CONTRAST:  11mL MULTIHANCE GADOBENATE DIMEGLUMINE 529 MG/ML IV SOLN

[Series 5: cor haste · coronal · 5.0mm · 0.74mm/px · 2 of 41 slices shown]
[im 1/41]
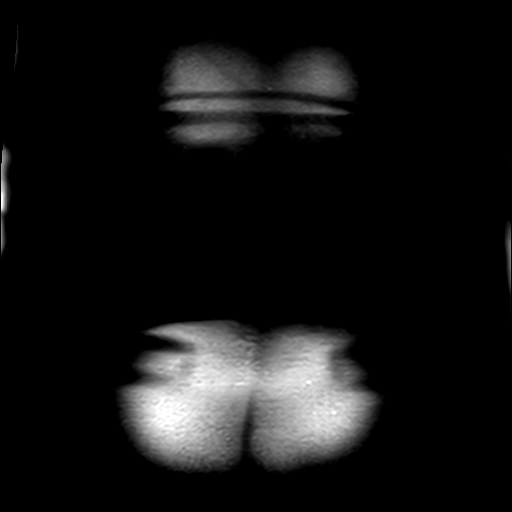
[im 41/41]
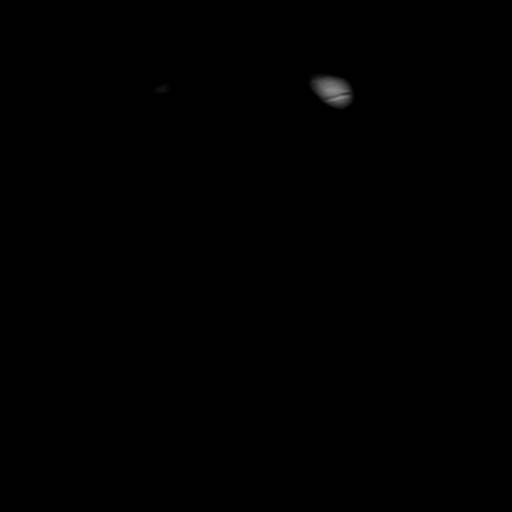

[Series 6: axial haste · axial · 6.0mm · 0.78mm/px · z∈[-122,+102]mm · 2 of 35 slices shown]
[im 1/35]
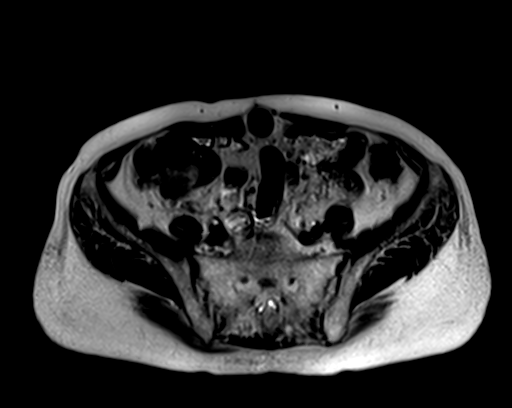
[im 35/35]
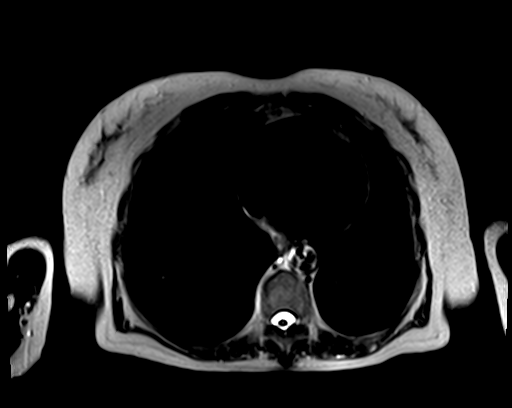

[Series 7: bSSFP · axial · 4.0mm · 0.72mm/px · z∈[-130,+110]mm · 2 of 61 slices shown (1 of 2)]
[im 1/61]
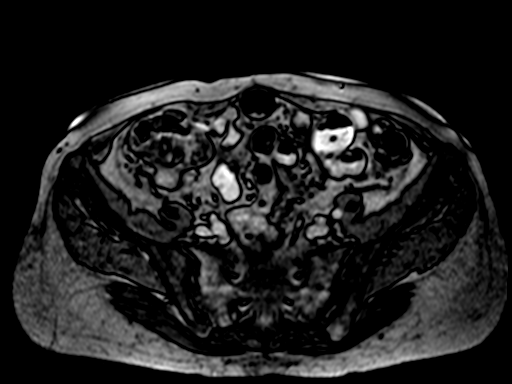
[im 61/61]
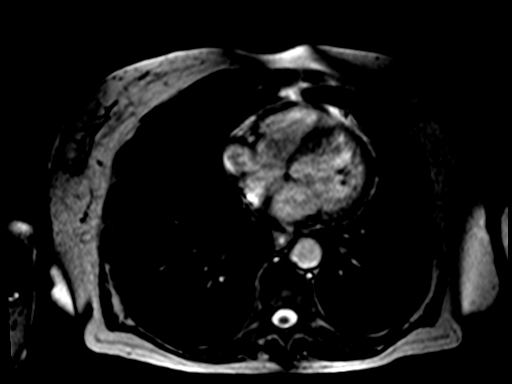

[Series 8: bSSFP · coronal · 5.0mm · 0.78mm/px · 1 of 34 slices shown (2 of 2)]
[im 1/34]
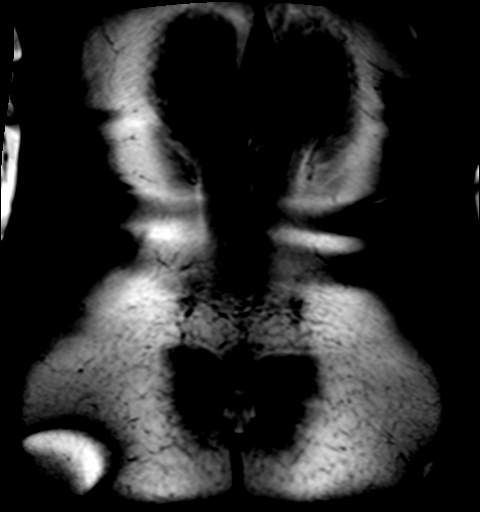

[Series 9: T2 · coronal · 3.0mm · 0.70mm/px · 2 of 56 slices shown]
[im 1/56]
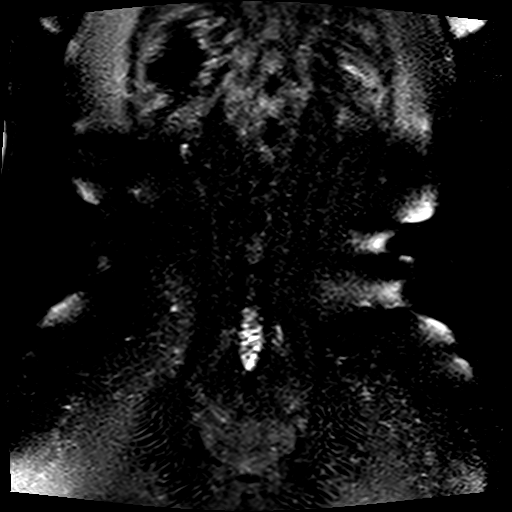
[im 56/56]
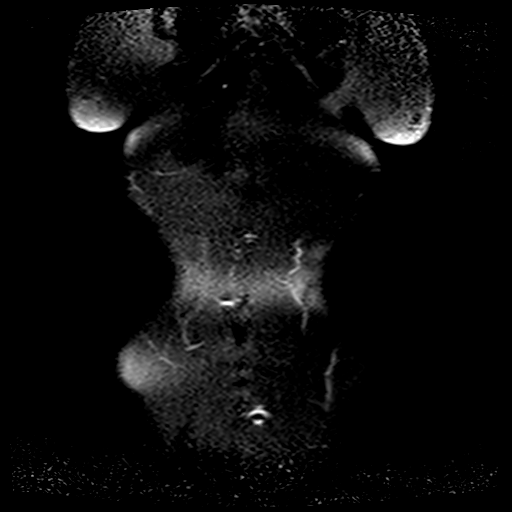

[Series 12: T1 · axial · 6.0mm · 0.74mm/px · z∈[-125,+106]mm · 2 of 72 slices shown]
[im 1/72]
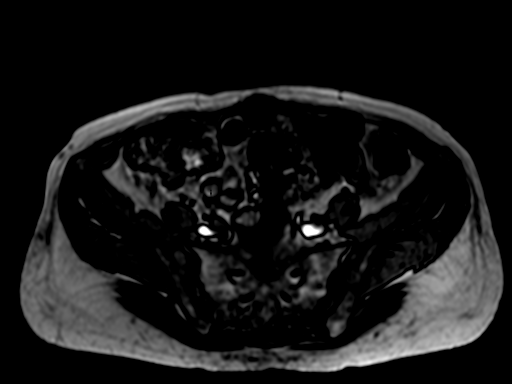
[im 72/72]
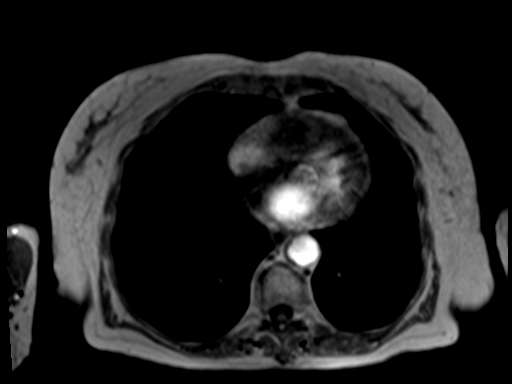

[Series 15: ep2d_diff_b50_500_800_p2_trig · axial · 6.0mm · 2.03mm/px · z∈[-108,+122]mm · 3 of 99 slices shown]
[im 1/99]
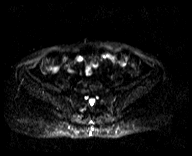
[im 50/99]
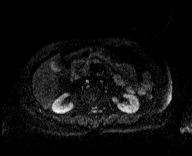
[im 99/99]
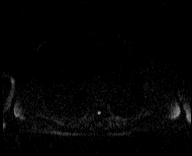

[Series 16: ep2d_diff_b50_500_800_p2_trig_adc · axial · 6.0mm · 2.03mm/px · 1 of 33 slices shown]
[im 1/33]
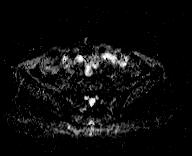

[Series 17: T2 fat-sat · axial · 6.0mm · 1.09mm/px · 1 of 32 slices shown]
[im 1/32]
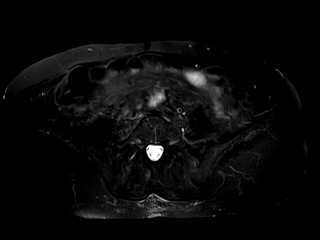

[Series 18: T1 dynamic · axial · non-contrast · 2.5mm · 0.74mm/px · z∈[-140,+98]mm · 3 of 96 slices shown]
[im 1/96]
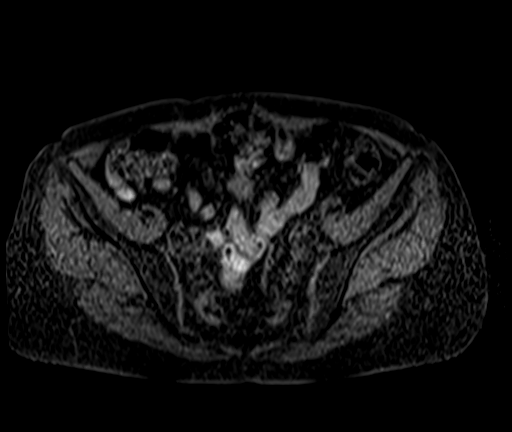
[im 48/96]
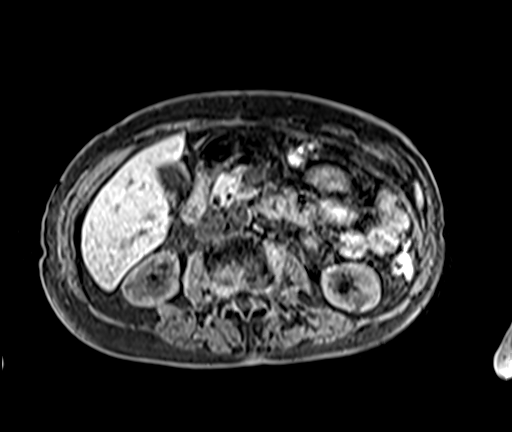
[im 96/96]
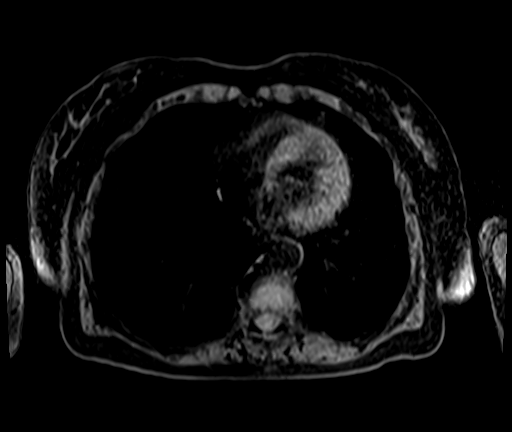

[Series 19: T1 dynamic post-contrast · axial · 2.5mm · 0.74mm/px · z∈[-140,+98]mm · 3 of 96 slices shown (1 of 3)]
[im 1/96]
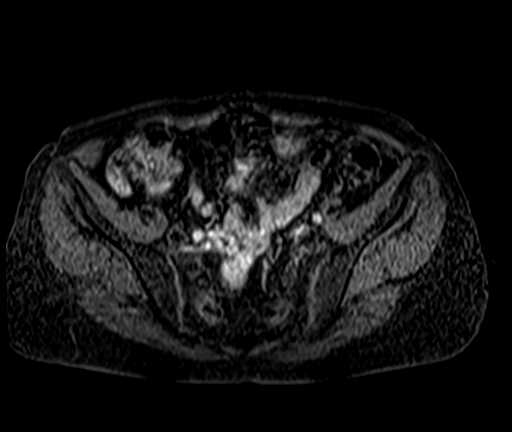
[im 48/96]
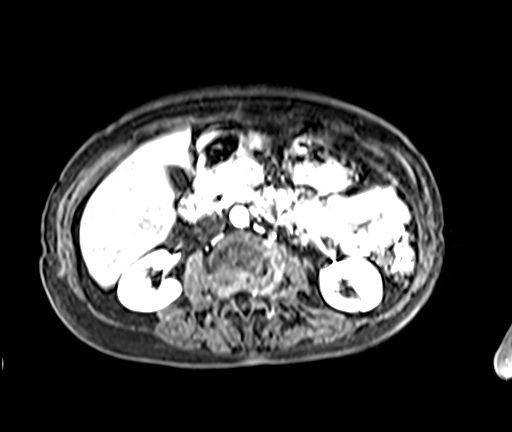
[im 96/96]
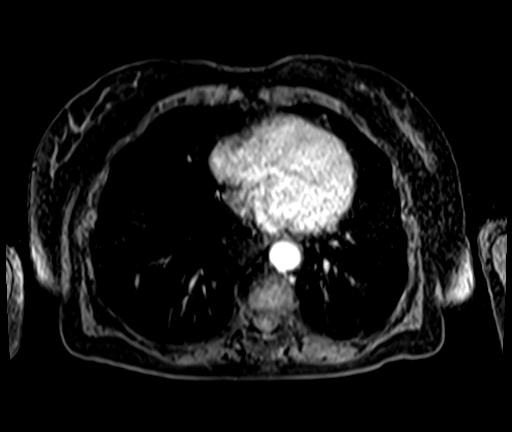

[Series 20: T1 dynamic post-contrast · axial · 2.5mm · 0.74mm/px · z∈[-140,+98]mm · 3 of 96 slices shown (2 of 3)]
[im 1/96]
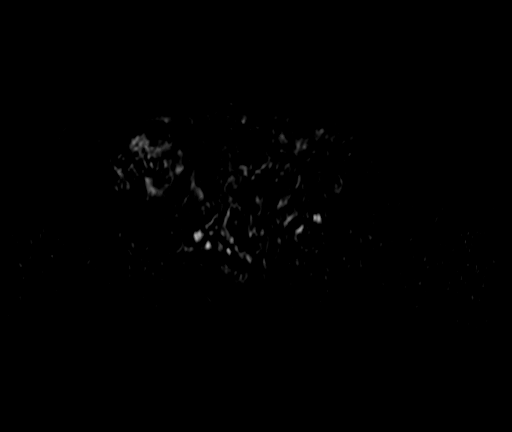
[im 48/96]
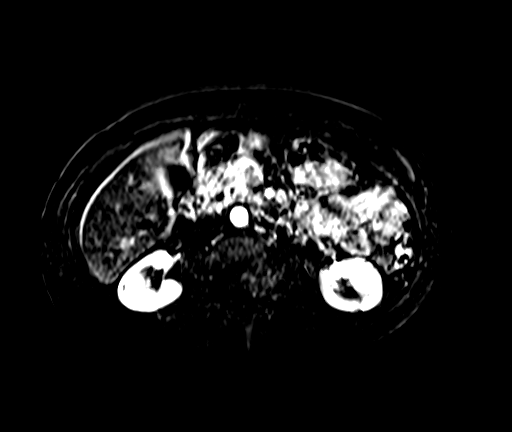
[im 96/96]
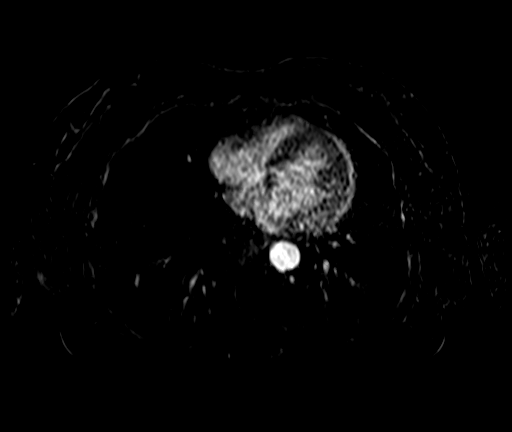

[Series 21: T1 dynamic post-contrast · axial · 2.5mm · 0.74mm/px · 1 of 96 slices shown (3 of 3)]
[im 1/96]
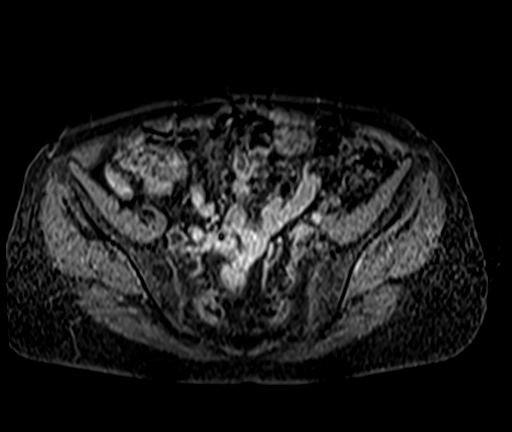

[26 of 48 positions shown; findings below may reference images not displayed]

FINDINGS: Lower chest: The lung bases are clear of an acute process. No
pleural or pericardial effusion.

Hepatobiliary: Small stable hepatic cysts. No worrisome hepatic
lesions or intrahepatic biliary dilatation. Normal caliber and
course of the common bile duct. The gallbladder is unremarkable.

Pancreas: Mild age related pancreatic atrophy. Associated mild main
duct prominence. Slight dilatation the pancreatic duct in the tail
region but this is not as pronounced as was on the prior study.
There is still a focal area of low signal intensity in the
pancreatic body which could be a calcification. No pancreatic mass
is identified. No acute inflammation.

Spleen:  Surgically absent.

Adrenals/Urinary Tract: The adrenal glands and kidneys are
unremarkable and stable. Small renal cysts are un changed.

Stomach/Bowel: The stomach, duodenum, visualized small bowel and
visualized colon are unremarkable.

Vascular/Lymphatic: Stable atherosclerotic changes involving the
aorta but no aneurysm or dissection. The major venous structures are
patent. No mesenteric or retroperitoneal mass or adenopathy.

Other:  No ascites or abdominal wall hernia.

Musculoskeletal: No significant bony findings.
IMPRESSION: 1. Stable mild dilatation of the pancreatic duct in the tail region
but not as pronounced as the prior study. There is still a focal
area of low signal intensity in the pancreatic body which could be a
calcification. No pancreatic mass is identified.
2. Stable small hepatic and renal cysts.
3. No acute abdominal findings, mass lesions or adenopathy.

## 2021-08-23 MED ORDER — GADOBENATE DIMEGLUMINE 529 MG/ML IV SOLN
11.0000 mL | Freq: Once | INTRAVENOUS | Status: AC | PRN
  Administered 2021-08-23: 11 mL via INTRAVENOUS

## 2021-08-27 ENCOUNTER — Other Ambulatory Visit: Payer: Self-pay | Admitting: Internal Medicine

## 2021-08-27 DIAGNOSIS — Z1231 Encounter for screening mammogram for malignant neoplasm of breast: Secondary | ICD-10-CM

## 2021-09-18 DIAGNOSIS — H0100A Unspecified blepharitis right eye, upper and lower eyelids: Secondary | ICD-10-CM | POA: Diagnosis not present

## 2021-09-18 DIAGNOSIS — H04123 Dry eye syndrome of bilateral lacrimal glands: Secondary | ICD-10-CM | POA: Diagnosis not present

## 2021-09-18 DIAGNOSIS — H524 Presbyopia: Secondary | ICD-10-CM | POA: Diagnosis not present

## 2021-09-18 DIAGNOSIS — H26491 Other secondary cataract, right eye: Secondary | ICD-10-CM | POA: Diagnosis not present

## 2021-09-19 ENCOUNTER — Other Ambulatory Visit: Payer: Self-pay

## 2021-09-19 ENCOUNTER — Inpatient Hospital Stay: Payer: Medicare HMO | Attending: Nurse Practitioner

## 2021-09-19 DIAGNOSIS — D519 Vitamin B12 deficiency anemia, unspecified: Secondary | ICD-10-CM

## 2021-09-19 DIAGNOSIS — E538 Deficiency of other specified B group vitamins: Secondary | ICD-10-CM | POA: Insufficient documentation

## 2021-09-19 MED ORDER — CYANOCOBALAMIN 1000 MCG/ML IJ SOLN
1000.0000 ug | Freq: Once | INTRAMUSCULAR | Status: AC
  Administered 2021-09-19: 1000 ug via INTRAMUSCULAR
  Filled 2021-09-19: qty 1

## 2021-10-02 ENCOUNTER — Inpatient Hospital Stay: Payer: Medicare HMO

## 2021-10-02 ENCOUNTER — Other Ambulatory Visit: Payer: Self-pay

## 2021-10-02 DIAGNOSIS — D72829 Elevated white blood cell count, unspecified: Secondary | ICD-10-CM

## 2021-10-02 DIAGNOSIS — D519 Vitamin B12 deficiency anemia, unspecified: Secondary | ICD-10-CM

## 2021-10-02 DIAGNOSIS — E538 Deficiency of other specified B group vitamins: Secondary | ICD-10-CM | POA: Diagnosis not present

## 2021-10-02 LAB — CBC WITH DIFFERENTIAL (CANCER CENTER ONLY)
Abs Immature Granulocytes: 0.03 10*3/uL (ref 0.00–0.07)
Basophils Absolute: 0.1 10*3/uL (ref 0.0–0.1)
Basophils Relative: 1 %
Eosinophils Absolute: 0.3 10*3/uL (ref 0.0–0.5)
Eosinophils Relative: 2 %
HCT: 35.9 % — ABNORMAL LOW (ref 36.0–46.0)
Hemoglobin: 12 g/dL (ref 12.0–15.0)
Immature Granulocytes: 0 %
Lymphocytes Relative: 37 %
Lymphs Abs: 4.8 10*3/uL — ABNORMAL HIGH (ref 0.7–4.0)
MCH: 30.7 pg (ref 26.0–34.0)
MCHC: 33.4 g/dL (ref 30.0–36.0)
MCV: 91.8 fL (ref 80.0–100.0)
Monocytes Absolute: 0.7 10*3/uL (ref 0.1–1.0)
Monocytes Relative: 6 %
Neutro Abs: 7.1 10*3/uL (ref 1.7–7.7)
Neutrophils Relative %: 54 %
Platelet Count: 435 10*3/uL — ABNORMAL HIGH (ref 150–400)
RBC: 3.91 MIL/uL (ref 3.87–5.11)
RDW: 14.4 % (ref 11.5–15.5)
WBC Count: 13 10*3/uL — ABNORMAL HIGH (ref 4.0–10.5)
nRBC: 0 % (ref 0.0–0.2)

## 2021-10-02 LAB — VITAMIN B12: Vitamin B-12: 437 pg/mL (ref 180–914)

## 2021-10-03 ENCOUNTER — Ambulatory Visit
Admission: RE | Admit: 2021-10-03 | Discharge: 2021-10-03 | Disposition: A | Discharge status: Home / Self Care | Payer: Medicare HMO | Source: Ambulatory Visit | Attending: Internal Medicine | Admitting: Internal Medicine

## 2021-10-03 DIAGNOSIS — Z1231 Encounter for screening mammogram for malignant neoplasm of breast: Secondary | ICD-10-CM

## 2021-10-03 IMAGING — MG MM DIGITAL SCREENING BILAT W/ TOMO AND CAD
8 series · 9 of 24 positions shown · non-contrast
Comparison: Previous exam(s).

CLINICAL DATA: Screening.

EXAM:
DIGITAL SCREENING BILATERAL MAMMOGRAM WITH TOMOSYNTHESIS AND CAD
TECHNIQUE: Bilateral screening digital craniocaudal and mediolateral oblique
mammograms were obtained. Bilateral screening digital breast
tomosynthesis was performed. The images were evaluated with
computer-aided detection.

[L MLO synth-2D]
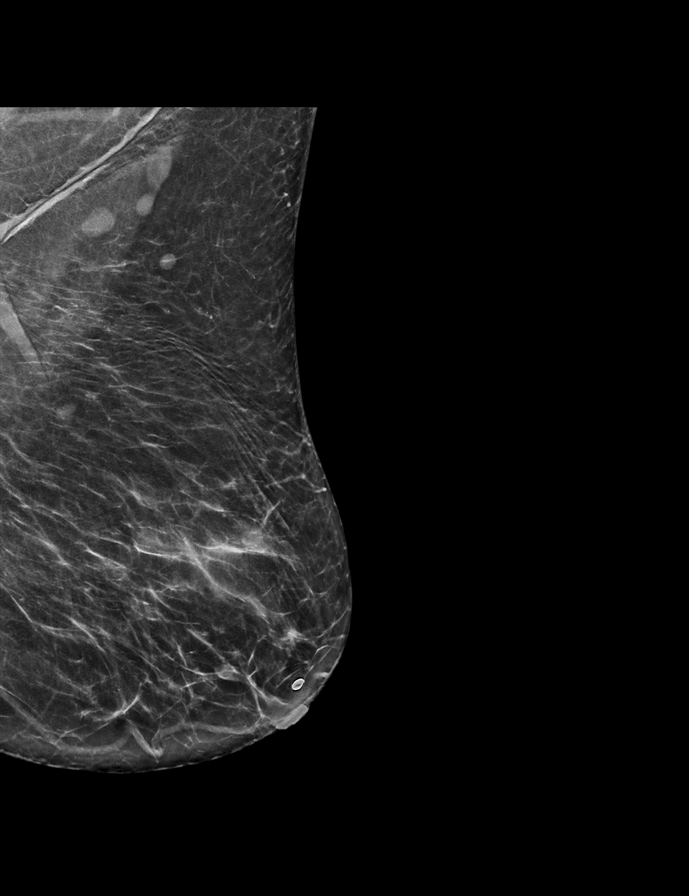

[R MLO synth-2D]
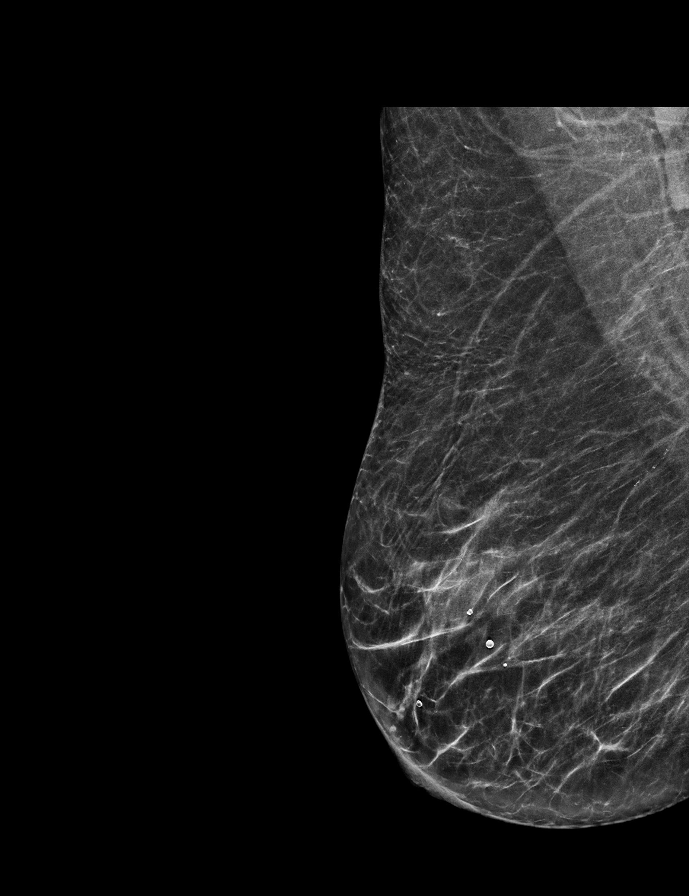

[R CC synth-2D]
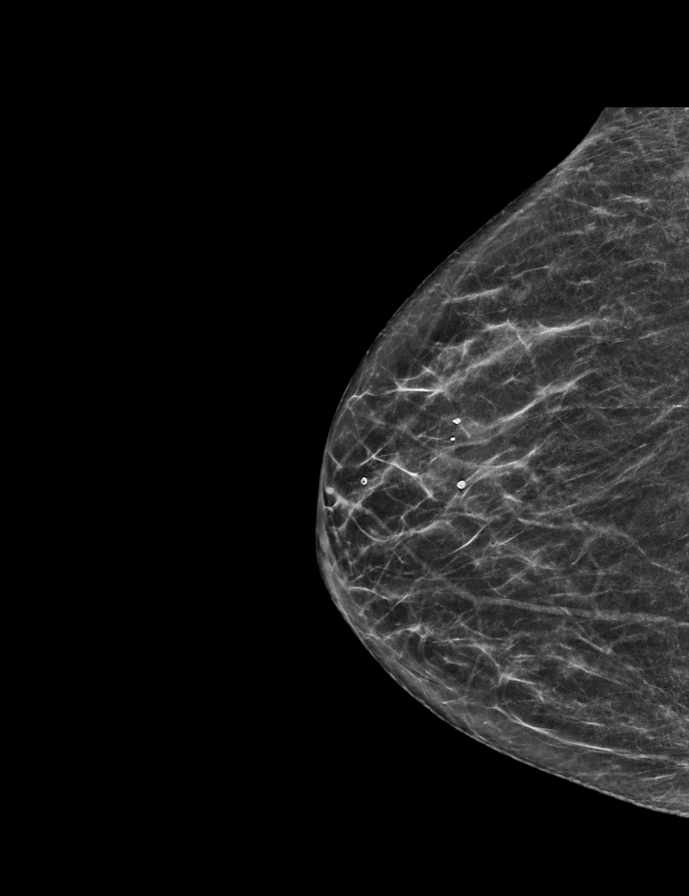

[L CC synth-2D]
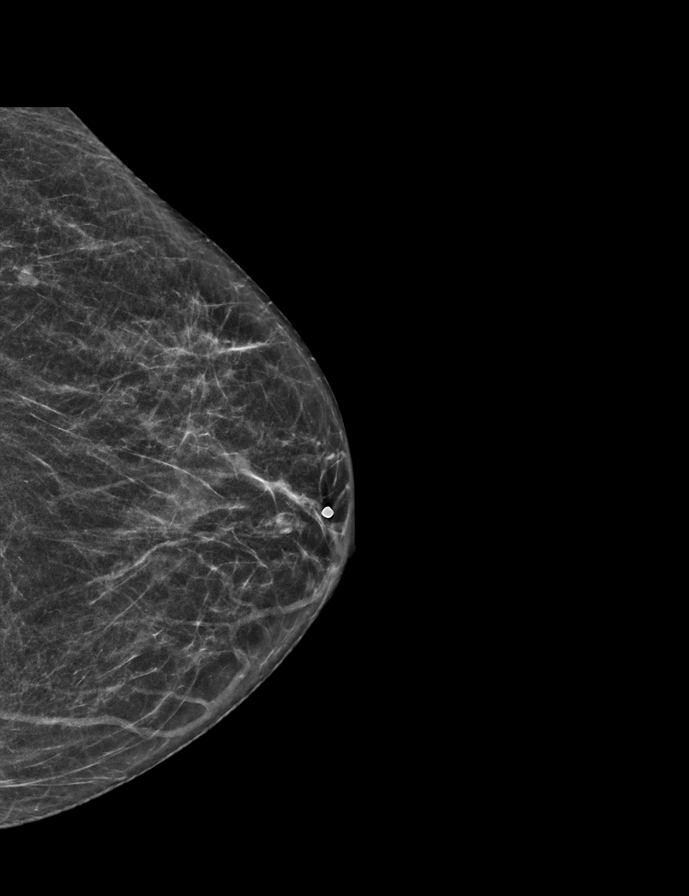

[R CC tomo · 2 of 49 frames shown]
[frame 16/49]
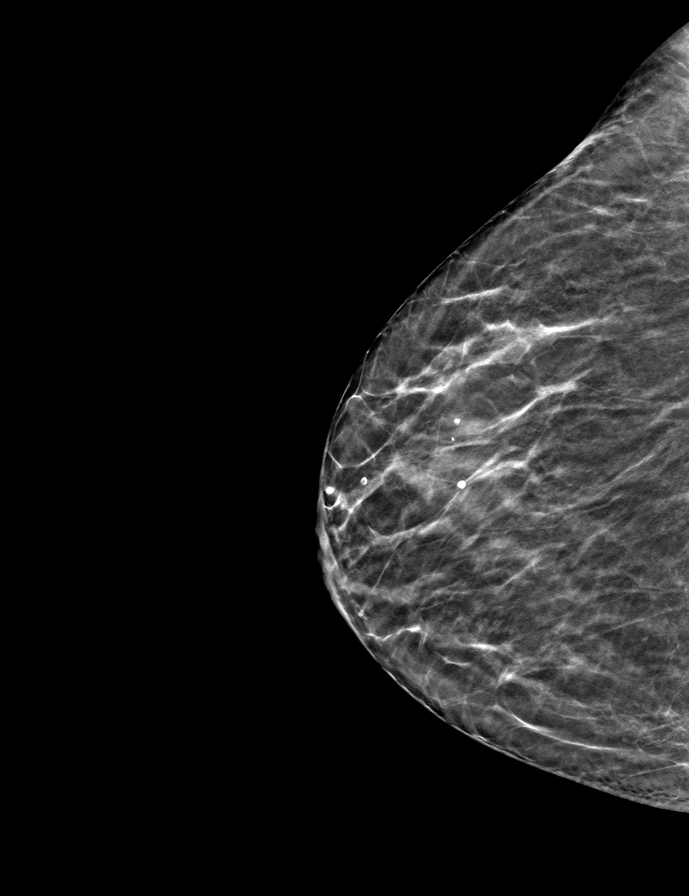
[frame 25/49]
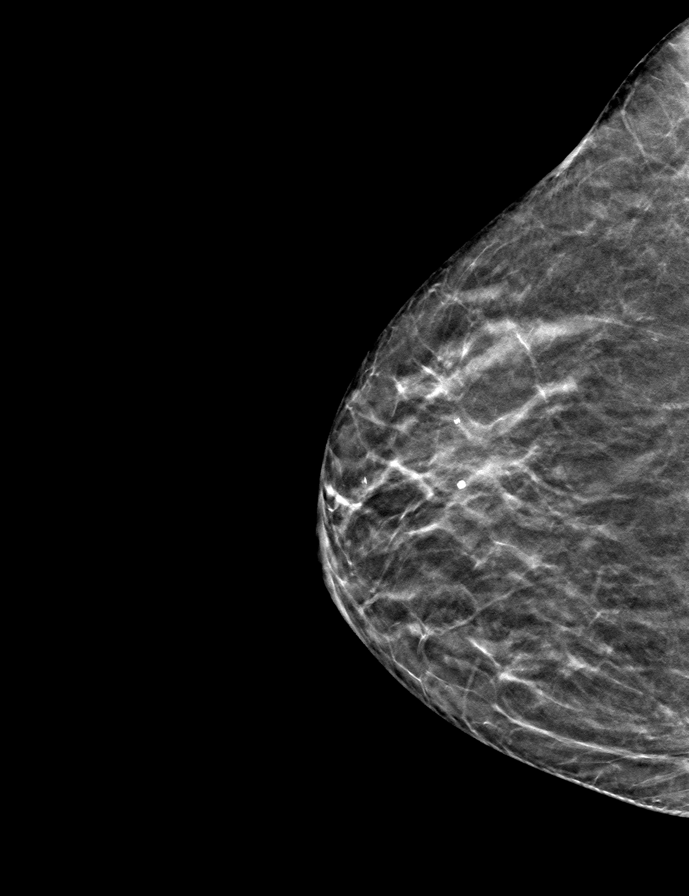

[L CC tomo · tomo slice 23/44.0]
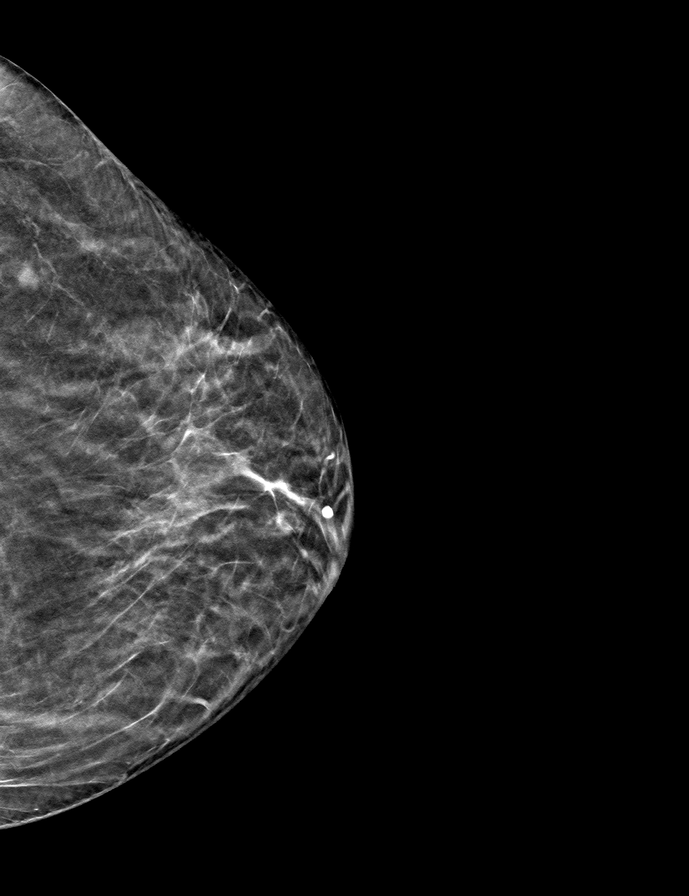

[R MLO tomo · tomo slice 28/55.0]
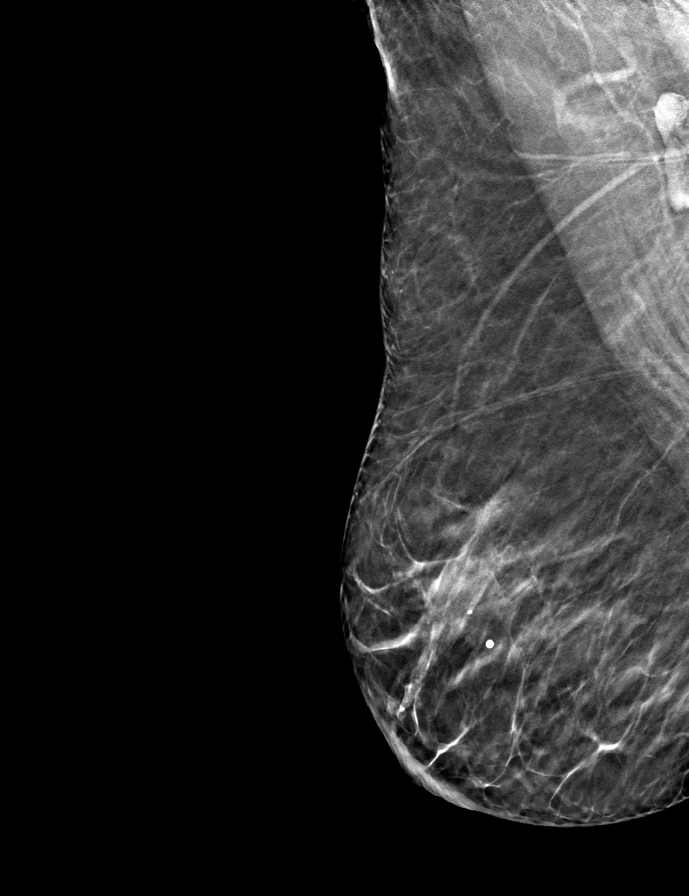

[L MLO tomo · tomo slice 29/57.0]
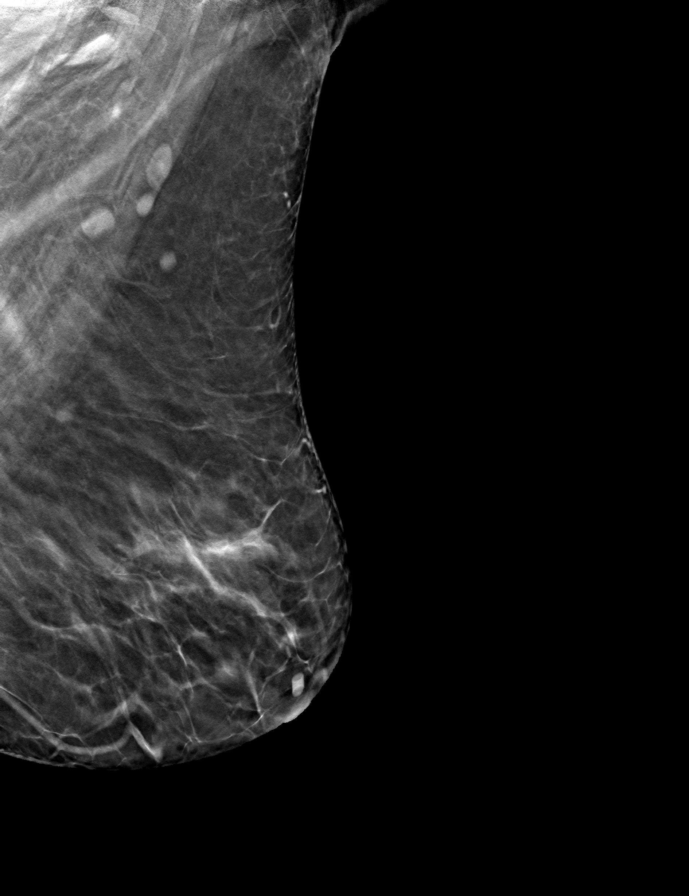

[9 of 24 positions shown; findings below may reference images not displayed]

ACR Breast Density Category b: There are scattered areas of
fibroglandular density.
FINDINGS: There are no findings suspicious for malignancy.
IMPRESSION: No mammographic evidence of malignancy. A result letter of this
screening mammogram will be mailed directly to the patient.

RECOMMENDATION:
Screening mammogram in one year. (Code:[BY])

BI-RADS CATEGORY  1: Negative.

## 2021-10-04 LAB — INTRINSIC FACTOR ANTIBODIES: Intrinsic Factor: 1 AU/mL (ref 0.0–1.1)

## 2021-10-17 ENCOUNTER — Other Ambulatory Visit: Payer: Self-pay

## 2021-10-17 ENCOUNTER — Inpatient Hospital Stay: Payer: Medicare HMO | Attending: Nurse Practitioner

## 2021-10-17 DIAGNOSIS — E538 Deficiency of other specified B group vitamins: Secondary | ICD-10-CM | POA: Diagnosis not present

## 2021-10-17 DIAGNOSIS — D519 Vitamin B12 deficiency anemia, unspecified: Secondary | ICD-10-CM

## 2021-10-17 MED ORDER — CYANOCOBALAMIN 1000 MCG/ML IJ SOLN
1000.0000 ug | Freq: Once | INTRAMUSCULAR | Status: AC
  Administered 2021-10-17: 1000 ug via INTRAMUSCULAR
  Filled 2021-10-17: qty 1

## 2021-11-14 ENCOUNTER — Other Ambulatory Visit: Payer: Self-pay

## 2021-11-14 ENCOUNTER — Inpatient Hospital Stay: Payer: Medicare HMO | Attending: Nurse Practitioner

## 2021-11-14 DIAGNOSIS — E538 Deficiency of other specified B group vitamins: Secondary | ICD-10-CM | POA: Diagnosis not present

## 2021-11-14 DIAGNOSIS — D519 Vitamin B12 deficiency anemia, unspecified: Secondary | ICD-10-CM

## 2021-11-14 MED ORDER — CYANOCOBALAMIN 1000 MCG/ML IJ SOLN
1000.0000 ug | Freq: Once | INTRAMUSCULAR | Status: AC
  Administered 2021-11-14: 1000 ug via INTRAMUSCULAR
  Filled 2021-11-14: qty 1

## 2021-12-12 ENCOUNTER — Other Ambulatory Visit: Payer: Self-pay

## 2021-12-12 ENCOUNTER — Inpatient Hospital Stay: Payer: Medicare HMO | Attending: Nurse Practitioner

## 2021-12-12 DIAGNOSIS — E538 Deficiency of other specified B group vitamins: Secondary | ICD-10-CM | POA: Insufficient documentation

## 2021-12-12 DIAGNOSIS — D519 Vitamin B12 deficiency anemia, unspecified: Secondary | ICD-10-CM

## 2021-12-12 MED ORDER — CYANOCOBALAMIN 1000 MCG/ML IJ SOLN
1000.0000 ug | Freq: Once | INTRAMUSCULAR | Status: AC
  Administered 2021-12-12: 1000 ug via INTRAMUSCULAR

## 2022-01-09 ENCOUNTER — Other Ambulatory Visit: Payer: Self-pay

## 2022-01-09 ENCOUNTER — Inpatient Hospital Stay: Payer: Medicare HMO | Attending: Nurse Practitioner

## 2022-01-09 DIAGNOSIS — D519 Vitamin B12 deficiency anemia, unspecified: Secondary | ICD-10-CM | POA: Insufficient documentation

## 2022-01-09 MED ORDER — CYANOCOBALAMIN 1000 MCG/ML IJ SOLN
1000.0000 ug | Freq: Once | INTRAMUSCULAR | Status: AC
  Administered 2022-01-09: 1000 ug via INTRAMUSCULAR
  Filled 2022-01-09: qty 1

## 2022-02-06 ENCOUNTER — Ambulatory Visit: Payer: Medicare HMO

## 2022-02-13 ENCOUNTER — Other Ambulatory Visit: Payer: Self-pay

## 2022-02-13 ENCOUNTER — Inpatient Hospital Stay: Payer: Medicare HMO | Attending: Nurse Practitioner

## 2022-02-13 DIAGNOSIS — D519 Vitamin B12 deficiency anemia, unspecified: Secondary | ICD-10-CM | POA: Diagnosis not present

## 2022-02-13 MED ORDER — CYANOCOBALAMIN 1000 MCG/ML IJ SOLN
1000.0000 ug | Freq: Once | INTRAMUSCULAR | Status: AC
  Administered 2022-02-13: 1000 ug via INTRAMUSCULAR
  Filled 2022-02-13: qty 1

## 2022-02-13 NOTE — Patient Instructions (Signed)
Vitamin B12 Deficiency Vitamin B12 deficiency means that your body does not have enough vitamin B12. The body needs this important vitamin: To make red blood cells. To make genes (DNA). To help the nerves work. If you do not have enough vitamin B12 in your body, you can have health problems, such as not having enough red blood cells in the blood (anemia). What are the causes? Not eating enough foods that contain vitamin B12. Not being able to take in (absorb) vitamin B12 from the food that you eat. Certain diseases. A condition in which the body does not make enough of a certain protein. This results in your body not taking in enough vitamin B12. Having a surgery in which part of the stomach or small intestine is taken out. Taking medicines that make it hard for the body to take in vitamin B12. These include: Heartburn medicines. Some medicines that are used to treat diabetes. What increases the risk? Being an older adult. Eating a vegetarian or vegan diet that does not include any foods that come from animals. Not eating enough foods that contain vitamin B12 while you are pregnant. Taking certain medicines. Having alcoholism. What are the signs or symptoms? In some cases, there are no symptoms. If the condition leads to too few blood cells or nerve damage, symptoms can occur, such as: Feeling weak or tired. Not being hungry. Losing feeling (numbness) or tingling in your hands and feet. Redness and burning of the tongue. Feeling sad (depressed). Confusion or memory problems. Trouble walking. If anemia is very bad, symptoms can include: Being short of breath. Being dizzy. Having a very fast heartbeat. How is this treated? Changing the way you eat and drink, such as: Eating more foods that contain vitamin B12. Drinking little or no alcohol. Getting vitamin B12 shots. Taking vitamin B12 supplements by mouth (orally). Your doctor will tell you the dose that is best for you. Follow  these instructions at home: Eating and drinking  Eat foods that come from animals and have a lot of vitamin B12 in them. These include: Meats and poultry. This includes beef, pork, chicken, turkey, and organ meats, such as liver. Seafood, such as clams, rainbow trout, salmon, tuna, and haddock. Eggs. Dairy foods such as milk, yogurt, and cheese. Eat breakfast cereals that have vitamin B12 added to them (are fortified). Check the label. The items listed above may not be a complete list of foods and beverages you can eat and drink. Contact a dietitian for more information. Alcohol use Do not drink alcohol if: Your doctor tells you not to drink. You are pregnant, may be pregnant, or are planning to become pregnant. If you drink alcohol: Limit how much you have to: 0-1 drink a day for women. 0-2 drinks a day for men. Know how much alcohol is in your drink. In the [place] one drink equals one 12 oz bottle of beer (355 mL), one 5 oz glass of wine (148 mL), or one 1 oz glass of hard liquor (44 mL). General instructions Get any vitamin B12 shots if told by your doctor. Take supplements only as told by your doctor. Follow the directions. Keep all follow-up visits. Contact a doctor if: Your symptoms come back. Your symptoms get worse or do not get better with treatment. Get help right away if: You have trouble breathing. You have a very fast heartbeat. You have chest pain. You get dizzy. You faint. These symptoms may be an emergency. Get help right away. Call 911.   Do not wait to see if the symptoms will go away. Do not drive yourself to the hospital. Summary Vitamin B12 deficiency means that your body is not getting enough of the vitamin. In some cases, there are no symptoms of this condition. Treatment may include making a change in the way you eat and drink, getting shots, or taking supplements. Eat foods that have vitamin B12 in them. This information is not intended to replace advice  given to you by your health care provider. Make sure you discuss any questions you have with your health care provider. Document Revised: 03/16/2021 Document Reviewed: 03/16/2021 Elsevier Patient Education  2023 [employer]  

## 2022-02-25 ENCOUNTER — Telehealth: Payer: Self-pay

## 2022-02-25 NOTE — Telephone Encounter (Signed)
This nurse received a message from this patient today stating that she has been having some abdominal pain, stated that pain feels the same as it did when she was diagnosed.  She has also seen her PCP about the pain.  Patient is wondering if her cancer is back.  Patient would like to know if she needs to have another MRI.  This information has been forwarded to the provider.

## 2022-02-26 DIAGNOSIS — Q8901 Asplenia (congenital): Secondary | ICD-10-CM | POA: Diagnosis not present

## 2022-02-26 DIAGNOSIS — Z1212 Encounter for screening for malignant neoplasm of rectum: Secondary | ICD-10-CM | POA: Diagnosis not present

## 2022-02-26 DIAGNOSIS — Z8719 Personal history of other diseases of the digestive system: Secondary | ICD-10-CM | POA: Diagnosis not present

## 2022-02-26 DIAGNOSIS — R109 Unspecified abdominal pain: Secondary | ICD-10-CM | POA: Diagnosis not present

## 2022-02-26 DIAGNOSIS — Z01419 Encounter for gynecological examination (general) (routine) without abnormal findings: Secondary | ICD-10-CM | POA: Diagnosis not present

## 2022-02-26 DIAGNOSIS — R748 Abnormal levels of other serum enzymes: Secondary | ICD-10-CM | POA: Diagnosis not present

## 2022-02-26 DIAGNOSIS — Z8589 Personal history of malignant neoplasm of other organs and systems: Secondary | ICD-10-CM | POA: Diagnosis not present

## 2022-02-26 DIAGNOSIS — D72829 Elevated white blood cell count, unspecified: Secondary | ICD-10-CM | POA: Diagnosis not present

## 2022-02-27 ENCOUNTER — Other Ambulatory Visit: Payer: Self-pay | Admitting: Registered Nurse

## 2022-02-27 DIAGNOSIS — R748 Abnormal levels of other serum enzymes: Secondary | ICD-10-CM

## 2022-02-27 DIAGNOSIS — D72829 Elevated white blood cell count, unspecified: Secondary | ICD-10-CM

## 2022-02-27 DIAGNOSIS — Q8901 Asplenia (congenital): Secondary | ICD-10-CM

## 2022-02-27 DIAGNOSIS — R109 Unspecified abdominal pain: Secondary | ICD-10-CM

## 2022-02-27 DIAGNOSIS — Z8589 Personal history of malignant neoplasm of other organs and systems: Secondary | ICD-10-CM

## 2022-02-28 ENCOUNTER — Telehealth: Payer: Self-pay

## 2022-02-28 NOTE — Telephone Encounter (Signed)
This nurse reached out to patient related to message left about abdominal pain.  This nurse left a message for patient to check My Chart or give [place] a call in the clinic.  No further concerns at this time.

## 2022-03-01 ENCOUNTER — Encounter: Payer: Self-pay | Admitting: Nurse Practitioner

## 2022-03-01 DIAGNOSIS — K5792 Diverticulitis of intestine, part unspecified, without perforation or abscess without bleeding: Secondary | ICD-10-CM | POA: Diagnosis not present

## 2022-03-01 DIAGNOSIS — R1032 Left lower quadrant pain: Secondary | ICD-10-CM | POA: Diagnosis not present

## 2022-03-04 DIAGNOSIS — R109 Unspecified abdominal pain: Secondary | ICD-10-CM | POA: Diagnosis not present

## 2022-03-04 DIAGNOSIS — R1032 Left lower quadrant pain: Secondary | ICD-10-CM | POA: Diagnosis not present

## 2022-03-05 ENCOUNTER — Encounter: Payer: Self-pay | Admitting: Nurse Practitioner

## 2022-03-06 ENCOUNTER — Ambulatory Visit: Payer: Medicare HMO

## 2022-03-08 ENCOUNTER — Other Ambulatory Visit: Payer: Self-pay | Admitting: Registered Nurse

## 2022-03-08 ENCOUNTER — Ambulatory Visit
Admission: RE | Admit: 2022-03-08 | Discharge: 2022-03-08 | Disposition: A | Discharge status: Home / Self Care | Payer: Medicare HMO | Source: Ambulatory Visit | Attending: Registered Nurse | Admitting: Registered Nurse

## 2022-03-08 DIAGNOSIS — R109 Unspecified abdominal pain: Secondary | ICD-10-CM

## 2022-03-08 DIAGNOSIS — R748 Abnormal levels of other serum enzymes: Secondary | ICD-10-CM

## 2022-03-08 DIAGNOSIS — Z8589 Personal history of malignant neoplasm of other organs and systems: Secondary | ICD-10-CM

## 2022-03-08 DIAGNOSIS — Q8901 Asplenia (congenital): Secondary | ICD-10-CM

## 2022-03-08 DIAGNOSIS — D72829 Elevated white blood cell count, unspecified: Secondary | ICD-10-CM

## 2022-03-08 MED ORDER — GADOBENATE DIMEGLUMINE 529 MG/ML IV SOLN
11.0000 mL | Freq: Once | INTRAVENOUS | Status: AC | PRN
  Administered 2022-03-08: 11 mL via INTRAVENOUS

## 2022-03-13 ENCOUNTER — Inpatient Hospital Stay: Payer: Medicare HMO | Attending: Nurse Practitioner

## 2022-03-13 ENCOUNTER — Other Ambulatory Visit: Payer: Self-pay

## 2022-03-13 DIAGNOSIS — Z85828 Personal history of other malignant neoplasm of skin: Secondary | ICD-10-CM | POA: Diagnosis not present

## 2022-03-13 DIAGNOSIS — D649 Anemia, unspecified: Secondary | ICD-10-CM | POA: Insufficient documentation

## 2022-03-13 DIAGNOSIS — E538 Deficiency of other specified B group vitamins: Secondary | ICD-10-CM | POA: Diagnosis not present

## 2022-03-13 DIAGNOSIS — D72820 Lymphocytosis (symptomatic): Secondary | ICD-10-CM | POA: Insufficient documentation

## 2022-03-13 DIAGNOSIS — Z90710 Acquired absence of both cervix and uterus: Secondary | ICD-10-CM | POA: Diagnosis not present

## 2022-03-13 DIAGNOSIS — D75839 Thrombocytosis, unspecified: Secondary | ICD-10-CM | POA: Insufficient documentation

## 2022-03-13 DIAGNOSIS — D72829 Elevated white blood cell count, unspecified: Secondary | ICD-10-CM | POA: Diagnosis not present

## 2022-03-13 DIAGNOSIS — D519 Vitamin B12 deficiency anemia, unspecified: Secondary | ICD-10-CM

## 2022-03-13 DIAGNOSIS — G629 Polyneuropathy, unspecified: Secondary | ICD-10-CM | POA: Diagnosis not present

## 2022-03-13 DIAGNOSIS — R1032 Left lower quadrant pain: Secondary | ICD-10-CM | POA: Diagnosis not present

## 2022-03-13 MED ORDER — CYANOCOBALAMIN 1000 MCG/ML IJ SOLN
1000.0000 ug | Freq: Once | INTRAMUSCULAR | Status: AC
  Administered 2022-03-13: 1000 ug via INTRAMUSCULAR
  Filled 2022-03-13: qty 1

## 2022-03-13 NOTE — Patient Instructions (Signed)
Vitamin B12 Deficiency Vitamin B12 deficiency occurs when the body does not have enough of this important vitamin. The body needs this vitamin: To make red blood cells. To make DNA. This is the genetic material inside cells. To help the nerves work properly so they can carry messages from the brain to the body. Vitamin B12 deficiency can cause health problems, such as not having enough red blood cells in the blood (anemia). This can lead to nerve damage if untreated. What are the causes? This condition may be caused by: Not eating enough foods that contain vitamin B12. Not having enough stomach acid and digestive fluids to properly absorb vitamin B12 from the food that you eat. Having certain diseases that make it hard to absorb vitamin B12. These diseases include Crohn's disease, chronic pancreatitis, and cystic fibrosis. An autoimmune disorder in which the body does not make enough of a protein (intrinsic factor) within the stomach, resulting in not enough absorption of vitamin B12. Having a surgery in which part of the stomach or small intestine is removed. Taking certain medicines that make it hard for the body to absorb vitamin B12. These include: Heartburn medicines, such as antacids and proton pump inhibitors. Some medicines that are used to treat diabetes. What increases the risk? The following factors may make you more likely to develop a vitamin B12 deficiency: Being an older adult. Eating a vegetarian or vegan diet that does not include any foods that come from animals. Eating a poor diet while you are pregnant. Taking certain medicines. Having alcoholism. What are the signs or symptoms? In some cases, there are no symptoms of this condition. If the condition leads to anemia or nerve damage, various symptoms may occur, such as: Weakness. Tiredness (fatigue). Loss of appetite. Numbness or tingling in your hands and feet. Redness and burning of the tongue. Depression,  confusion, or memory problems. Trouble walking. If anemia is severe, symptoms can include: Shortness of breath. Dizziness. Rapid heart rate. How is this diagnosed? This condition may be diagnosed with a blood test to measure the level of vitamin B12 in your blood. You may also have other tests, including: A group of tests that measure certain characteristics of blood cells (complete blood count, CBC). A blood test to measure intrinsic factor. A procedure where a thin tube with a camera on the end is used to look into your stomach or intestines (endoscopy). Other tests may be needed to discover the cause of the deficiency. How is this treated? Treatment for this condition depends on the cause. This condition may be treated by: Changing your eating and drinking habits, such as: Eating more foods that contain vitamin B12. Drinking less alcohol or no alcohol. Getting vitamin B12 injections. Taking vitamin B12 supplements by mouth (orally). Your health care provider will tell you which dose is best for you. Follow these instructions at home: Eating and drinking  Include foods in your diet that come from animals and contain a lot of vitamin B12. These include: Meats and poultry. This includes beef, pork, chicken, turkey, and organ meats, such as liver. Seafood. This includes clams, rainbow trout, salmon, tuna, and haddock. Eggs. Dairy foods such as milk, yogurt, and cheese. Eat foods that have vitamin B12 added to them (are fortified), such as ready-to-eat breakfast cereals. Check the label on the package to see if a food is fortified. The items listed above may not be a complete list of foods and beverages you can eat and drink. Contact a dietitian for   more information. Alcohol use Do not drink alcohol if: Your health care provider tells you not to drink. You are pregnant, may be pregnant, or are planning to become pregnant. If you drink alcohol: Limit how much you have to: 0-1 drink a  day for women. 0-2 drinks a day for men. Know how much alcohol is in your drink. In the [place] one drink equals one 12 oz bottle of beer (355 mL), one 5 oz glass of wine (148 mL), or one 1 oz glass of hard liquor (44 mL). General instructions Get vitamin B12 injections if told to by your health care provider. Take supplements only as told by your health care provider. Follow the directions carefully. Keep all follow-up visits. This is important. Contact a health care provider if: Your symptoms come back. Your symptoms get worse or do not improve with treatment. Get help right away: You develop shortness of breath. You have a rapid heart rate. You have chest pain. You become dizzy or you faint. These symptoms may be an emergency. Get help right away. Call 911. Do not wait to see if the symptoms will go away. Do not drive yourself to the hospital. Summary Vitamin B12 deficiency occurs when the body does not have enough of this important vitamin. Common causes include not eating enough foods that contain vitamin B12, not being able to absorb vitamin B12 from the food that you eat, having a surgery in which part of the stomach or small intestine is removed, or taking certain medicines. Eat foods that have vitamin B12 in them. Treatment may include making a change in the way you eat and drink, getting vitamin B12 injections, or taking vitamin B12 supplements. This information is not intended to replace advice given to you by your health care provider. Make sure you discuss any questions you have with your health care provider. Document Revised: 03/16/2021 Document Reviewed: 03/16/2021 Elsevier Patient Education  2023 Elsevier Inc.  

## 2022-03-20 ENCOUNTER — Encounter: Payer: Self-pay | Admitting: *Deleted

## 2022-03-20 DIAGNOSIS — D2261 Melanocytic nevi of right upper limb, including shoulder: Secondary | ICD-10-CM | POA: Diagnosis not present

## 2022-03-20 DIAGNOSIS — D2262 Melanocytic nevi of left upper limb, including shoulder: Secondary | ICD-10-CM | POA: Diagnosis not present

## 2022-03-20 DIAGNOSIS — D1801 Hemangioma of skin and subcutaneous tissue: Secondary | ICD-10-CM | POA: Diagnosis not present

## 2022-03-20 DIAGNOSIS — L814 Other melanin hyperpigmentation: Secondary | ICD-10-CM | POA: Diagnosis not present

## 2022-03-20 DIAGNOSIS — Z85828 Personal history of other malignant neoplasm of skin: Secondary | ICD-10-CM | POA: Diagnosis not present

## 2022-03-20 DIAGNOSIS — L821 Other seborrheic keratosis: Secondary | ICD-10-CM | POA: Diagnosis not present

## 2022-03-20 DIAGNOSIS — B078 Other viral warts: Secondary | ICD-10-CM | POA: Diagnosis not present

## 2022-03-21 DIAGNOSIS — D649 Anemia, unspecified: Secondary | ICD-10-CM | POA: Diagnosis not present

## 2022-03-22 ENCOUNTER — Ambulatory Visit: Payer: Medicare HMO | Admitting: Diagnostic Neuroimaging

## 2022-03-22 ENCOUNTER — Encounter: Payer: Self-pay | Admitting: Diagnostic Neuroimaging

## 2022-03-22 VITALS — BP 154/78 | HR 67 | Ht 63.0 in | Wt 123.2 lb

## 2022-03-22 DIAGNOSIS — T451X5A Adverse effect of antineoplastic and immunosuppressive drugs, initial encounter: Secondary | ICD-10-CM | POA: Diagnosis not present

## 2022-03-22 DIAGNOSIS — G620 Drug-induced polyneuropathy: Secondary | ICD-10-CM | POA: Diagnosis not present

## 2022-03-22 DIAGNOSIS — T148XXA Other injury of unspecified body region, initial encounter: Secondary | ICD-10-CM

## 2022-03-22 DIAGNOSIS — E538 Deficiency of other specified B group vitamins: Secondary | ICD-10-CM

## 2022-03-22 DIAGNOSIS — G63 Polyneuropathy in diseases classified elsewhere: Secondary | ICD-10-CM | POA: Diagnosis not present

## 2022-03-22 NOTE — Patient Instructions (Signed)
INTERMITTENT, POSITIONAL NUMBNESS IN FEET (likely sequelae of chemotherapy neuropathy; also had B12 deficiency in Aug 2022, now treated; also borderline A1c; likely intermittent compression neuropathy based on position) - continue B12 treatments - caution with feet hygiene, balance and driving

## 2022-03-22 NOTE — Progress Notes (Signed)
[facility]  PATIENT: Elaine [name] DOB: September 12, 1931  REFERRING CLINICIAN: Holland Commons, FNP HISTORY FROM: patient  REASON FOR VISIT: new consult    HISTORICAL  CHIEF COMPLAINT:  Chief Complaint  Patient presents with   New Patient (Initial Visit)    Room 6 - alone. Referred for worsening numbness in bilateral legs. History of chemotherapy.    HISTORY OF PRESENT ILLNESS:   88 year old female here for evaluation of numbness.  Had chemotherapy in 2008 and developed chemotherapy neuropathy.  This was mild and stable for many years.  About 1 year ago she had worsening intermittent numbness when driving and sitting in the recliner.  She was found to have significant B12 deficiency and started on replacement.  Symptoms have been stable since that time.  She has a baseline mild level of numbness in her toes feet and ankles.  This can worsen when she is driving for a long time such as 45 minutes or longer or when she is sitting in reclining chair.  She stands and walks and moves around the numbness subsides.  She has some mild low back pain but no radiation into the lower back.  Overall exercise and movement seem to help her symptoms.  No significant pain.   REVIEW OF SYSTEMS: Full 14 system review of systems performed and negative with exception of: as per HPI.  ALLERGIES: Allergies  Allergen Reactions   Aleve [Naproxen Sodium] Swelling   Belladonna Alkaloids Other (See Comments)    Facial edema   Ciprofloxacin Other (See Comments)    Cannot have high doses   Other Other (See Comments)   Oxycodone-Acetaminophen Nausea And Vomiting   Percocet [Oxycodone-Acetaminophen] Nausea And Vomiting   Anzemet [Dolasetron] Rash   Oxycodone Rash   Oxycontin [Oxycodone Hcl] Rash   Sulfamethoxazole-Trimethoprim Rash   Vicodin [Hydrocodone-Acetaminophen] Rash    HOME MEDICATIONS: Outpatient Medications Prior to Visit  Medication Sig Dispense Refill   calcium  carbonate (OS-CAL) 600 MG TABS Take 600 mg by mouth 2 (two) times daily with a meal.     calcium carbonate (TUMS - DOSED IN MG ELEMENTAL CALCIUM) 500 MG chewable tablet Chew 1 tablet by mouth as needed for heartburn.     cholecalciferol (VITAMIN D) 1000 UNITS tablet Take 1,000 Units by mouth daily.     fish oil-omega-3 fatty acids 1000 MG capsule Take 4,800 mg by mouth daily.      levothyroxine (SYNTHROID, LEVOTHROID) 50 MCG tablet 50 mcg Daily.     Multiple Vitamins-Minerals (MULTIVITAMIN WITH MINERALS) tablet Take 1 tablet by mouth daily.     Wheat Dextrin (BENEFIBER PO) Take 1 packet by mouth daily.     No facility-administered medications prior to visit.    PAST MEDICAL HISTORY: Past Medical History:  Diagnosis Date   Acid reflux    Diverticulitis    Hypothyroid    Leukocytosis    Lung cancer ([place])    Neuropathy    secondary to chemo x 2   Osteopenia    Peritoneal carcinoma ([place]) 2006   with lung met; at [facility]; Dr. Meriam Sprague.     PAST SURGICAL HISTORY: Past Surgical History:  Procedure Laterality Date   ABDOMINAL HYSTERECTOMY  2006   COLONOSCOPY  2012   neg.    OOPHORECTOMY  2006   SPLENECTOMY  2006   TONSILLECTOMY     TOTAL ABDOMINAL HYSTERECTOMY W/ BILATERAL SALPINGOOPHORECTOMY  2006    FAMILY HISTORY: Family History  Problem Relation Age of Onset  Heart failure Mother        congestive HF   Heart attack Mother    Cancer Father        colon   Lung disease Father        miner   Cancer Sister        leukemia and ovarian cancer   ALS Sister    Lung disease Brother        black lung.    Breast cancer Neg Hx     SOCIAL HISTORY: Social History   Socioeconomic History   Marital status: Divorced    Spouse name: Not on file   Number of children: 3   Years of education: some college   Highest education level: Not on file  Occupational History    Comment: retired Photographer  Tobacco Use   Smoking status: Former    Packs/day: 0.25    Years:  5.00    Total pack years: 1.25    Types: Cigarettes    Quit date: 08/05/1978    Years since quitting: 43.6   Smokeless tobacco: Never  Substance and Sexual Activity   Alcohol use: Not Currently   Drug use: No   Sexual activity: Not on file  Other Topics Concern   Not on file  Social History Narrative   Lives at home alone.  Caffeine 2 cups daily.  Retired.  3 children.  Right-handed.   Social Determinants of Health   Financial Resource Strain: Not on file  Food Insecurity: Not on file  Transportation Needs: Not on file  Physical Activity: Not on file  Stress: Not on file  Social Connections: Not on file  Intimate Partner Violence: Not on file     PHYSICAL EXAM  GENERAL EXAM/CONSTITUTIONAL: Vitals:  Vitals:   03/22/22 1110  BP: (!) 154/78  Pulse: 67  Weight: 123 lb 3.2 oz (55.9 kg)  Height: '5\' 3"'  (1.6 m)   Body mass index is 21.82 kg/m. Wt Readings from Last 3 Encounters:  03/22/22 123 lb 3.2 oz (55.9 kg)  04/02/21 126 lb 11.2 oz (57.5 kg)  03/30/20 125 lb 9.6 oz (57 kg)   Patient is in no distress; well developed, nourished and groomed; neck is supple  CARDIOVASCULAR: Examination of carotid arteries is normal; no carotid bruits Regular rate and rhythm, no murmurs Examination of peripheral vascular system by observation and palpation is normal  EYES: Ophthalmoscopic exam of optic discs and posterior segments is normal; no papilledema or hemorrhages No results found.  MUSCULOSKELETAL: Gait, strength, tone, movements noted in Neurologic exam below  NEUROLOGIC: MENTAL STATUS:      No data to display         awake, alert, oriented to person, place and time recent and remote memory intact normal attention and concentration language fluent, comprehension intact, naming intact fund of knowledge appropriate  CRANIAL NERVE:  2nd - no papilledema on fundoscopic exam 2nd, 3rd, 4th, 6th - pupils equal and reactive to light, visual fields full to  confrontation, extraocular muscles intact, no nystagmus 5th - facial sensation symmetric 7th - facial strength symmetric 8th - hearing intact 9th - palate elevates symmetrically, uvula midline 11th - shoulder shrug symmetric 12th - tongue protrusion midline  MOTOR:  normal bulk and tone, full strength in the BUE, BLE  SENSORY:  normal and symmetric to light touch, pinprick, temperature, vibration; EXCEPT DECR IN FEET  COORDINATION:  finger-nose-finger, fine finger movements normal  REFLEXES:  deep tendon reflexes TRACE IN ANKLES; OTHERWISE  1+ and symmetric  GAIT/STATION:  narrow based gait    DIAGNOSTIC DATA (LABS, IMAGING, TESTING) - I reviewed patient records, labs, notes, testing and imaging myself where available.  Lab Results  Component Value Date   WBC 13.0 (H) 10/02/2021   HGB 12.0 10/02/2021   HCT 35.9 (L) 10/02/2021   MCV 91.8 10/02/2021   PLT 435 (H) 10/02/2021      Component Value Date/Time   NA 139 04/26/2013 1042   K 4.5 04/26/2013 1042   CL 104 05/04/2012 1010   CO2 27 04/26/2013 1042   GLUCOSE 96 04/26/2013 1042   GLUCOSE 98 05/04/2012 1010   BUN 12.7 04/26/2013 1042   CREATININE 0.8 04/26/2013 1042   CALCIUM 9.8 04/26/2013 1042   PROT 7.0 04/26/2013 1042   ALBUMIN 3.5 04/26/2013 1042   AST 16 04/26/2013 1042   ALT 12 04/26/2013 1042   ALKPHOS 45 04/26/2013 1042   BILITOT 0.33 04/26/2013 1042   No results found for: "CHOL", "HDL", "LDLCALC", "LDLDIRECT", "TRIG", "CHOLHDL" No results found for: "HGBA1C"  Vitamin B-12  Date Value Ref Range Status  10/02/2021 437 180 - 914 pg/mL Final    Comment:    (NOTE) This assay is not validated for testing neonatal or myeloproliferative syndrome specimens for Vitamin B12 levels. Performed at [facility] [place], [place] [address], [place], [place] [address]   04/02/2021 174 (L) 180 - 914 pg/mL Final    Comment:    (NOTE) This assay is not validated for testing neonatal  or myeloproliferative syndrome specimens for Vitamin B12 levels. Performed at [facility], [place] [address], [place], [place] [address]    No results found for: "TSH"  A1c 6.1    ASSESSMENT AND PLAN  88 y.o. year old female here with:  Dx:  1. Chemotherapy-induced neuropathy ([place])   2. Vitamin B12 deficiency neuropathy ([place])   3. Nerve compression     PLAN:  INTERMITTENT, POSITIONAL NUMBNESS IN FEET (likely sequelae of chemotherapy neuropathy; also had B12 deficiency in Aug 2022, now treated; also borderline A1c; likely intermittent compression neuropathy based on position) - continue B12 treatments - caution with feet hygiene, balance and driving  Return for return to PCP, pending if symptoms worsen or fail to improve.    Penni Bombard, MD 0/16/4290, 37:95 AM Certified in Neurology, Neurophysiology and Neuroimaging  [facility] [address], [place], [place] [address] [phone]

## 2022-03-28 DIAGNOSIS — D72829 Elevated white blood cell count, unspecified: Secondary | ICD-10-CM | POA: Diagnosis not present

## 2022-03-28 DIAGNOSIS — Z8589 Personal history of malignant neoplasm of other organs and systems: Secondary | ICD-10-CM | POA: Diagnosis not present

## 2022-03-28 DIAGNOSIS — Z0000 Encounter for general adult medical examination without abnormal findings: Secondary | ICD-10-CM | POA: Diagnosis not present

## 2022-03-28 DIAGNOSIS — Z780 Asymptomatic menopausal state: Secondary | ICD-10-CM | POA: Diagnosis not present

## 2022-03-28 DIAGNOSIS — E538 Deficiency of other specified B group vitamins: Secondary | ICD-10-CM | POA: Diagnosis not present

## 2022-03-28 DIAGNOSIS — E039 Hypothyroidism, unspecified: Secondary | ICD-10-CM | POA: Diagnosis not present

## 2022-03-28 DIAGNOSIS — M8580 Other specified disorders of bone density and structure, unspecified site: Secondary | ICD-10-CM | POA: Diagnosis not present

## 2022-03-28 DIAGNOSIS — R7303 Prediabetes: Secondary | ICD-10-CM | POA: Diagnosis not present

## 2022-03-28 DIAGNOSIS — M810 Age-related osteoporosis without current pathological fracture: Secondary | ICD-10-CM | POA: Diagnosis not present

## 2022-04-01 NOTE — Progress Notes (Unsigned)
[name]   [phone] [phone]   Clinic Follow up Note   Patient Care Team: Deland Pretty, MD as PCP - General (Internal Medicine) Nobie Putnam, MD (Hematology and Oncology) Fay Records, MD as Referring Physician (Obstetrics and Gynecology) Jodi Marble, MD (Otolaryngology) Truitt Merle, MD as Consulting Physician (Hematology) Alla Feeling, NP as Nurse Practitioner (Nurse Practitioner) 04/02/2022  CHIEF COMPLAINT: Follow up lymphocytosis and thrombocytosis   CURRENT THERAPY: Observation  INTERVAL HISTORY: Elaine [name] returns for follow up as scheduled. Last seen by me 04/02/21 and continues monthly B12 injections here.  She had a bout of diverticulitis and was prescribed Cipro, developed a drug rash which has resolved.  She had an abdominal MRI and saw Dr. Perley Jain PA.  She also has neuropathy which appears to be positional, with prolonged sitting. She is doing well otherwise.  Energy and appetite are normal, she walks 2 miles a day, cuts her grass, and exercises.  Denies fever, night sweats, adenopathy, unintentional weight loss, signs of thrombosis, or other new specific complaints.   MEDICAL HISTORY:  Past Medical History:  Diagnosis Date   Acid reflux    Diverticulitis    Hypothyroid    Leukocytosis    Lung cancer ([place])    Neuropathy    secondary to chemo x 2   Osteopenia    Peritoneal carcinoma ([place]) 2006   with lung met; at [facility]; Dr. Meriam Sprague.     SURGICAL HISTORY: Past Surgical History:  Procedure Laterality Date   ABDOMINAL HYSTERECTOMY  2006   COLONOSCOPY  2012   neg.    OOPHORECTOMY  2006   SPLENECTOMY  2006   TONSILLECTOMY     TOTAL ABDOMINAL HYSTERECTOMY W/ BILATERAL SALPINGOOPHORECTOMY  2006    I have reviewed the social history and family history with the patient and they are unchanged from previous note.  ALLERGIES:  is allergic to aleve [naproxen sodium], belladonna alkaloids, ciprofloxacin, other,  oxycodone-acetaminophen, percocet [oxycodone-acetaminophen], anzemet [dolasetron], oxycodone, oxycontin [oxycodone hcl], sulfamethoxazole-trimethoprim, and vicodin [hydrocodone-acetaminophen].  MEDICATIONS:  Current Outpatient Medications  Medication Sig Dispense Refill   calcium carbonate (OS-CAL) 600 MG TABS Take 600 mg by mouth 2 (two) times daily with a meal.     calcium carbonate (TUMS - DOSED IN MG ELEMENTAL CALCIUM) 500 MG chewable tablet Chew 1 tablet by mouth as needed for heartburn.     cholecalciferol (VITAMIN D) 1000 UNITS tablet Take 1,000 Units by mouth daily.     fish oil-omega-3 fatty acids 1000 MG capsule Take 4,800 mg by mouth daily.      levothyroxine (SYNTHROID, LEVOTHROID) 50 MCG tablet 50 mcg Daily.     Multiple Vitamins-Minerals (MULTIVITAMIN WITH MINERALS) tablet Take 1 tablet by mouth daily.     Wheat Dextrin (BENEFIBER PO) Take 1 packet by mouth daily.     No current facility-administered medications for this visit.    PHYSICAL EXAMINATION: ECOG PERFORMANCE STATUS: 0 - Asymptomatic  Vitals:   04/02/22 0937  BP: (!) 141/70  Pulse: 81  Resp: 18  Temp: 98.2 F (36.8 C)  SpO2: 100%   Filed Weights   04/02/22 0937  Weight: 122 lb 14.4 oz (55.7 kg)    GENERAL:alert, no distress and comfortable SKIN: No rash EYES:  sclera clear NECK: Without mass LYMPH:  no palpable cervical, supraclavicular, or axillary lymphadenopathy  LUNGS: clear with normal breathing effort HEART: regular rate & rhythm, no lower extremity edema ABDOMEN:abdomen soft, non-tender and normal bowel sounds NEURO: alert & oriented  x 3 with fluent speech, no focal motor deficits  LABORATORY DATA:  I have reviewed the data as listed    Latest Ref Rng & Units 04/02/2022    9:15 AM 10/02/2021    1:16 PM 04/02/2021    9:27 AM  CBC  WBC 4.0 - 10.5 K/uL 10.4  13.0  11.9   Hemoglobin 12.0 - 15.0 g/dL 11.9  12.0  11.6   Hematocrit 36.0 - 46.0 % 34.5  35.9  34.5    36.1   Platelets 150 - 400  K/uL 436  435  449         Latest Ref Rng & Units 04/26/2013   10:42 AM 05/04/2012   10:10 AM  CMP  Glucose 70 - 140 mg/dl 96  98   BUN 7.0 - 26.0 mg/dL 12.7  11.0   Creatinine 0.6 - 1.1 mg/dL 0.8  0.8   Sodium 136 - 145 mEq/L 139  139   Potassium 3.5 - 5.1 mEq/L 4.5  4.7   Chloride 98 - 107 mEq/L  104   CO2 22 - 29 mEq/L 27  25   Calcium 8.4 - 10.4 mg/dL 9.8  10.0   Total Protein 6.4 - 8.3 g/dL 7.0  6.9   Total Bilirubin 0.20 - 1.20 mg/dL 0.33  0.30   Alkaline Phos 40 - 150 U/L 45  62   AST 5 - 34 U/L 16  18   ALT 0 - 55 U/L 12  17       RADIOGRAPHIC STUDIES: I have personally reviewed the radiological images as listed and agreed with the findings in the report. No results found.   ASSESSMENT & PLAN: Elaine [name] is a 88 y.o. female with    1. Leukocytosis, lymphocytosis, and thrombocytosis likely reactive  -She has chronic mild leukocytosis with lymphocytosis and intermittent thrombocytosis since 2013. Jak2 05/04/12 was negative.  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   -Patient has had mild intermittent lymphocytosis for >7 years. No neutropenia. She has mild anemia Hgb 11.6 - 12. No B symptoms. This is felt to be reactive or related to previous splenectomy, unlikely LGL leukemia.  -She was found to have B12 deficiency, intrinsic factor is normal. On monthly injections, tolerating well with good response -Elaine [name] is clinically doing well, no B symptoms.  Exam is benign.  CBC shows normal WBC and lymphocytes, Hgb 11.9, platelets 436, overall improved -Continue observation annual follow-up   2.  ?Pancreatic lesion  -Found incidentally on MRI 04/10/2021, stable on q6 month surveillance MRIs -Followed by GI Dr. Watt Climes, EUS/biopsy has not been commended yet -She recently had diverticulitis which has resolved.   3. 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 Rhodia Albright at [facility] and discharged from f/u in 2017.  -she reports a non-melanoma skin cancer removed from her head, continue f/u with derm    4. Health maintenance  -last mammo 10/03/21 was negative -She does not plan to have another colonoscopy  -continues walking 2 miles per day, manages yard, and exercises.  She is cautious with activity due to neuropathy (secondary to previous chemo, B12 deficiency, and positional) -Continue healthy active lifestyle  Plan: -Recent imaging and today's Labs reviewed -Continue CBC observation -  B12 inj today and continue monthly -Lab in 6 and 12 months -Follow-up in 12 months, or sooner if needed -Continue routine follow-up with PCP, GI, neuro, and care team   All questions were answered. The patient knows to call the clinic with any problems, questions or concerns. No barriers to learning were detected.     Alla Feeling, NP 04/02/22

## 2022-04-02 ENCOUNTER — Encounter: Payer: Self-pay | Admitting: Nurse Practitioner

## 2022-04-02 ENCOUNTER — Inpatient Hospital Stay: Payer: Medicare HMO | Admitting: Nurse Practitioner

## 2022-04-02 ENCOUNTER — Other Ambulatory Visit: Payer: Self-pay

## 2022-04-02 ENCOUNTER — Inpatient Hospital Stay: Payer: Medicare HMO

## 2022-04-02 VITALS — BP 141/70 | HR 81 | Temp 98.2°F | Resp 18 | Ht 63.0 in | Wt 122.9 lb

## 2022-04-02 DIAGNOSIS — D519 Vitamin B12 deficiency anemia, unspecified: Secondary | ICD-10-CM | POA: Diagnosis not present

## 2022-04-02 DIAGNOSIS — G629 Polyneuropathy, unspecified: Secondary | ICD-10-CM | POA: Diagnosis not present

## 2022-04-02 DIAGNOSIS — Z85828 Personal history of other malignant neoplasm of skin: Secondary | ICD-10-CM | POA: Diagnosis not present

## 2022-04-02 DIAGNOSIS — D649 Anemia, unspecified: Secondary | ICD-10-CM | POA: Diagnosis not present

## 2022-04-02 DIAGNOSIS — D72820 Lymphocytosis (symptomatic): Secondary | ICD-10-CM | POA: Diagnosis not present

## 2022-04-02 DIAGNOSIS — D72829 Elevated white blood cell count, unspecified: Secondary | ICD-10-CM

## 2022-04-02 DIAGNOSIS — D75839 Thrombocytosis, unspecified: Secondary | ICD-10-CM | POA: Diagnosis not present

## 2022-04-02 DIAGNOSIS — Z90710 Acquired absence of both cervix and uterus: Secondary | ICD-10-CM | POA: Diagnosis not present

## 2022-04-02 DIAGNOSIS — E538 Deficiency of other specified B group vitamins: Secondary | ICD-10-CM | POA: Diagnosis not present

## 2022-04-02 LAB — CBC WITH DIFFERENTIAL (CANCER CENTER ONLY)
Abs Immature Granulocytes: 0.04 10*3/uL (ref 0.00–0.07)
Basophils Absolute: 0.1 10*3/uL (ref 0.0–0.1)
Basophils Relative: 1 %
Eosinophils Absolute: 0.2 10*3/uL (ref 0.0–0.5)
Eosinophils Relative: 2 %
HCT: 34.5 % — ABNORMAL LOW (ref 36.0–46.0)
Hemoglobin: 11.9 g/dL — ABNORMAL LOW (ref 12.0–15.0)
Immature Granulocytes: 0 %
Lymphocytes Relative: 32 %
Lymphs Abs: 3.3 10*3/uL (ref 0.7–4.0)
MCH: 31.4 pg (ref 26.0–34.0)
MCHC: 34.5 g/dL (ref 30.0–36.0)
MCV: 91 fL (ref 80.0–100.0)
Monocytes Absolute: 0.5 10*3/uL (ref 0.1–1.0)
Monocytes Relative: 5 %
Neutro Abs: 6.2 10*3/uL (ref 1.7–7.7)
Neutrophils Relative %: 60 %
Platelet Count: 436 10*3/uL — ABNORMAL HIGH (ref 150–400)
RBC: 3.79 MIL/uL — ABNORMAL LOW (ref 3.87–5.11)
RDW: 14.6 % (ref 11.5–15.5)
WBC Count: 10.4 10*3/uL (ref 4.0–10.5)
nRBC: 0 % (ref 0.0–0.2)

## 2022-04-02 LAB — VITAMIN B12: Vitamin B-12: 538 pg/mL (ref 180–914)

## 2022-04-02 MED ORDER — CYANOCOBALAMIN 1000 MCG/ML IJ SOLN
1000.0000 ug | Freq: Once | INTRAMUSCULAR | Status: DC
  Filled 2022-04-02: qty 1

## 2022-04-02 MED ORDER — CYANOCOBALAMIN 1000 MCG/ML IJ SOLN: 1000.0000 ug | Freq: Once | INTRAMUSCULAR | Status: DC

## 2022-04-11 ENCOUNTER — Inpatient Hospital Stay: Payer: Medicare HMO | Attending: Nurse Practitioner

## 2022-04-11 ENCOUNTER — Other Ambulatory Visit: Payer: Self-pay

## 2022-04-11 DIAGNOSIS — E538 Deficiency of other specified B group vitamins: Secondary | ICD-10-CM | POA: Diagnosis not present

## 2022-04-11 DIAGNOSIS — D519 Vitamin B12 deficiency anemia, unspecified: Secondary | ICD-10-CM

## 2022-04-11 MED ORDER — CYANOCOBALAMIN 1000 MCG/ML IJ SOLN
1000.0000 ug | Freq: Once | INTRAMUSCULAR | Status: AC
  Administered 2022-04-11: 1000 ug via INTRAMUSCULAR
  Filled 2022-04-11: qty 1

## 2022-05-14 ENCOUNTER — Inpatient Hospital Stay: Payer: Medicare HMO | Attending: Nurse Practitioner

## 2022-05-14 VITALS — BP 174/89 | HR 71 | Temp 99.3°F | Resp 18

## 2022-05-14 DIAGNOSIS — D519 Vitamin B12 deficiency anemia, unspecified: Secondary | ICD-10-CM

## 2022-05-14 DIAGNOSIS — E538 Deficiency of other specified B group vitamins: Secondary | ICD-10-CM | POA: Insufficient documentation

## 2022-05-14 MED ORDER — CYANOCOBALAMIN 1000 MCG/ML IJ SOLN
1000.0000 ug | Freq: Once | INTRAMUSCULAR | Status: AC
Start: 2022-05-14 — End: 2022-05-14
  Administered 2022-05-14: 1000 ug via INTRAMUSCULAR
  Filled 2022-05-14: qty 1

## 2022-05-14 NOTE — Patient Instructions (Signed)
Vitamin B12 Deficiency Vitamin B12 deficiency occurs when the body does not have enough of this important vitamin. The body needs this vitamin: To make red blood cells. To make DNA. This is the genetic material inside cells. To help the nerves work properly so they can carry messages from the brain to the body. Vitamin B12 deficiency can cause health problems, such as not having enough red blood cells in the blood (anemia). This can lead to nerve damage if untreated. What are the causes? This condition may be caused by: Not eating enough foods that contain vitamin B12. Not having enough stomach acid and digestive fluids to properly absorb vitamin B12 from the food that you eat. Having certain diseases that make it hard to absorb vitamin B12. These diseases include Crohn's disease, chronic pancreatitis, and cystic fibrosis. An autoimmune disorder in which the body does not make enough of a protein (intrinsic factor) within the stomach, resulting in not enough absorption of vitamin B12. Having a surgery in which part of the stomach or small intestine is removed. Taking certain medicines that make it hard for the body to absorb vitamin B12. These include: Heartburn medicines, such as antacids and proton pump inhibitors. Some medicines that are used to treat diabetes. What increases the risk? The following factors may make you more likely to develop a vitamin B12 deficiency: Being an older adult. Eating a vegetarian or vegan diet that does not include any foods that come from animals. Eating a poor diet while you are pregnant. Taking certain medicines. Having alcoholism. What are the signs or symptoms? In some cases, there are no symptoms of this condition. If the condition leads to anemia or nerve damage, various symptoms may occur, such as: Weakness. Tiredness (fatigue). Loss of appetite. Numbness or tingling in your hands and feet. Redness and burning of the tongue. Depression,  confusion, or memory problems. Trouble walking. If anemia is severe, symptoms can include: Shortness of breath. Dizziness. Rapid heart rate. How is this diagnosed? This condition may be diagnosed with a blood test to measure the level of vitamin B12 in your blood. You may also have other tests, including: A group of tests that measure certain characteristics of blood cells (complete blood count, CBC). A blood test to measure intrinsic factor. A procedure where a thin tube with a camera on the end is used to look into your stomach or intestines (endoscopy). Other tests may be needed to discover the cause of the deficiency. How is this treated? Treatment for this condition depends on the cause. This condition may be treated by: Changing your eating and drinking habits, such as: Eating more foods that contain vitamin B12. Drinking less alcohol or no alcohol. Getting vitamin B12 injections. Taking vitamin B12 supplements by mouth (orally). Your health care provider will tell you which dose is best for you. Follow these instructions at home: Eating and drinking  Include foods in your diet that come from animals and contain a lot of vitamin B12. These include: Meats and poultry. This includes beef, pork, chicken, turkey, and organ meats, such as liver. Seafood. This includes clams, rainbow trout, salmon, tuna, and haddock. Eggs. Dairy foods such as milk, yogurt, and cheese. Eat foods that have vitamin B12 added to them (are fortified), such as ready-to-eat breakfast cereals. Check the label on the package to see if a food is fortified. The items listed above may not be a complete list of foods and beverages you can eat and drink. Contact a dietitian for   more information. Alcohol use Do not drink alcohol if: Your health care provider tells you not to drink. You are pregnant, may be pregnant, or are planning to become pregnant. If you drink alcohol: Limit how much you have to: 0-1 drink a  day for women. 0-2 drinks a day for men. Know how much alcohol is in your drink. In the [place] one drink equals one 12 oz bottle of beer (355 mL), one 5 oz glass of wine (148 mL), or one 1 oz glass of hard liquor (44 mL). General instructions Get vitamin B12 injections if told to by your health care provider. Take supplements only as told by your health care provider. Follow the directions carefully. Keep all follow-up visits. This is important. Contact a health care provider if: Your symptoms come back. Your symptoms get worse or do not improve with treatment. Get help right away: You develop shortness of breath. You have a rapid heart rate. You have chest pain. You become dizzy or you faint. These symptoms may be an emergency. Get help right away. Call 911. Do not wait to see if the symptoms will go away. Do not drive yourself to the hospital. Summary Vitamin B12 deficiency occurs when the body does not have enough of this important vitamin. Common causes include not eating enough foods that contain vitamin B12, not being able to absorb vitamin B12 from the food that you eat, having a surgery in which part of the stomach or small intestine is removed, or taking certain medicines. Eat foods that have vitamin B12 in them. Treatment may include making a change in the way you eat and drink, getting vitamin B12 injections, or taking vitamin B12 supplements. This information is not intended to replace advice given to you by your health care provider. Make sure you discuss any questions you have with your health care provider. Document Revised: 03/16/2021 Document Reviewed: 03/16/2021 Elsevier Patient Education  2023 Elsevier Inc.  

## 2022-05-17 DIAGNOSIS — Z23 Encounter for immunization: Secondary | ICD-10-CM | POA: Diagnosis not present

## 2022-05-20 DIAGNOSIS — L600 Ingrowing nail: Secondary | ICD-10-CM | POA: Diagnosis not present

## 2022-05-20 DIAGNOSIS — I739 Peripheral vascular disease, unspecified: Secondary | ICD-10-CM | POA: Diagnosis not present

## 2022-06-05 DIAGNOSIS — M205X2 Other deformities of toe(s) (acquired), left foot: Secondary | ICD-10-CM | POA: Diagnosis not present

## 2022-06-05 DIAGNOSIS — L600 Ingrowing nail: Secondary | ICD-10-CM | POA: Diagnosis not present

## 2022-06-05 DIAGNOSIS — I739 Peripheral vascular disease, unspecified: Secondary | ICD-10-CM | POA: Diagnosis not present

## 2022-06-13 ENCOUNTER — Other Ambulatory Visit: Payer: Self-pay

## 2022-06-13 ENCOUNTER — Inpatient Hospital Stay: Payer: Medicare HMO | Attending: Nurse Practitioner

## 2022-06-13 DIAGNOSIS — E538 Deficiency of other specified B group vitamins: Secondary | ICD-10-CM | POA: Insufficient documentation

## 2022-06-13 DIAGNOSIS — D519 Vitamin B12 deficiency anemia, unspecified: Secondary | ICD-10-CM

## 2022-06-13 MED ORDER — CYANOCOBALAMIN 1000 MCG/ML IJ SOLN
1000.0000 ug | Freq: Once | INTRAMUSCULAR | Status: AC
  Administered 2022-06-13: 1000 ug via INTRAMUSCULAR
  Filled 2022-06-13: qty 1

## 2022-06-17 DIAGNOSIS — Z8719 Personal history of other diseases of the digestive system: Secondary | ICD-10-CM | POA: Diagnosis not present

## 2022-07-09 ENCOUNTER — Emergency Department (HOSPITAL_BASED_OUTPATIENT_CLINIC_OR_DEPARTMENT_OTHER)
Admission: EM | Admit: 2022-07-09 | Discharge: 2022-07-09 | Disposition: A | Discharge status: Home / Self Care | Payer: Medicare HMO | Attending: Emergency Medicine | Admitting: Emergency Medicine

## 2022-07-09 ENCOUNTER — Encounter (HOSPITAL_BASED_OUTPATIENT_CLINIC_OR_DEPARTMENT_OTHER): Payer: Self-pay | Admitting: Radiology

## 2022-07-09 ENCOUNTER — Emergency Department (HOSPITAL_BASED_OUTPATIENT_CLINIC_OR_DEPARTMENT_OTHER): Payer: Medicare HMO

## 2022-07-09 ENCOUNTER — Other Ambulatory Visit: Payer: Self-pay

## 2022-07-09 DIAGNOSIS — Y92007 Garden or yard of unspecified non-institutional (private) residence as the place of occurrence of the external cause: Secondary | ICD-10-CM | POA: Insufficient documentation

## 2022-07-09 DIAGNOSIS — I10 Essential (primary) hypertension: Secondary | ICD-10-CM | POA: Diagnosis not present

## 2022-07-09 DIAGNOSIS — Z043 Encounter for examination and observation following other accident: Secondary | ICD-10-CM | POA: Diagnosis not present

## 2022-07-09 DIAGNOSIS — W01198A Fall on same level from slipping, tripping and stumbling with subsequent striking against other object, initial encounter: Secondary | ICD-10-CM | POA: Diagnosis not present

## 2022-07-09 DIAGNOSIS — Z85118 Personal history of other malignant neoplasm of bronchus and lung: Secondary | ICD-10-CM | POA: Diagnosis not present

## 2022-07-09 DIAGNOSIS — W19XXXA Unspecified fall, initial encounter: Secondary | ICD-10-CM

## 2022-07-09 DIAGNOSIS — F17200 Nicotine dependence, unspecified, uncomplicated: Secondary | ICD-10-CM | POA: Diagnosis not present

## 2022-07-09 DIAGNOSIS — M47812 Spondylosis without myelopathy or radiculopathy, cervical region: Secondary | ICD-10-CM | POA: Diagnosis not present

## 2022-07-09 DIAGNOSIS — Z8589 Personal history of malignant neoplasm of other organs and systems: Secondary | ICD-10-CM | POA: Diagnosis not present

## 2022-07-09 DIAGNOSIS — S0990XA Unspecified injury of head, initial encounter: Secondary | ICD-10-CM | POA: Diagnosis not present

## 2022-07-09 LAB — CBC
HCT: 35.7 % — ABNORMAL LOW (ref 36.0–46.0)
Hemoglobin: 11.9 g/dL — ABNORMAL LOW (ref 12.0–15.0)
MCH: 30.8 pg (ref 26.0–34.0)
MCHC: 33.3 g/dL (ref 30.0–36.0)
MCV: 92.5 fL (ref 80.0–100.0)
Platelets: 480 10*3/uL — ABNORMAL HIGH (ref 150–400)
RBC: 3.86 MIL/uL — ABNORMAL LOW (ref 3.87–5.11)
RDW: 14 % (ref 11.5–15.5)
WBC: 12.8 10*3/uL — ABNORMAL HIGH (ref 4.0–10.5)
nRBC: 0 % (ref 0.0–0.2)

## 2022-07-09 LAB — BASIC METABOLIC PANEL
Anion gap: 11 (ref 5–15)
BUN: 10 mg/dL (ref 8–23)
CO2: 27 mmol/L (ref 22–32)
Calcium: 9.7 mg/dL (ref 8.9–10.3)
Chloride: 95 mmol/L — ABNORMAL LOW (ref 98–111)
Creatinine, Ser: 0.62 mg/dL (ref 0.44–1.00)
GFR, Estimated: >60 mL/min (ref >60)
Glucose, Bld: 110 mg/dL — ABNORMAL HIGH (ref 70–99)
Potassium: 3.7 mmol/L (ref 3.5–5.1)
Sodium: 133 mmol/L — ABNORMAL LOW (ref 135–145)

## 2022-07-09 LAB — TROPONIN I (HIGH SENSITIVITY): Troponin I (High Sensitivity): 7 ng/L (ref <18)

## 2022-07-09 MED ORDER — ACETAMINOPHEN 325 MG PO TABS
650.0000 mg | ORAL_TABLET | Freq: Once | ORAL | Status: AC
  Administered 2022-07-09: 650 mg via ORAL
  Filled 2022-07-09: qty 2

## 2022-07-09 NOTE — ED Triage Notes (Signed)
Patient arrives ambulatory to triage with complaints of hitting her head due to a fall one week ago. Patient states that she had a mechanical fall in her garden, falling and hitting her head on concrete. Patient reports no dizziness and she does not take blood thinners. Reports some soreness to her head.   Her PCP referred her here for a head CT.

## 2022-07-09 NOTE — Discharge Instructions (Addendum)
Follow-up with your primary care soon as you are able for repeat evaluation regarding your elevated blood pressure.  Please not hesitate to return to emergency department for worrisome signs and symptoms we discussed become apparent.

## 2022-07-09 NOTE — ED Notes (Signed)
RN provided AVS using Teachback Method. Patient verbalizes understanding of Discharge Instructions. Opportunity for Questioning and Answers were provided by RN. Patient Discharged from ED ambulatory to Home via Self.  

## 2022-07-09 NOTE — ED Provider Notes (Signed)
[facility] EMERGENCY DEPT Provider Note   CSN: [id number] Arrival date & time: 07/09/22  1313     History  Chief Complaint  Patient presents with   Fall   Head Injury    Elaine [name] is a 88 y.o. female.   Fall  Head Injury   88 year old female presents emergency department after fall.  Patient states that she had a fall approximately 1 week ago when she was pulling tomato plants in her garden.  She reports a "stubborn tomato plant" they require more effort to pull of which after Early's, she fell backwards hitting the left side of her head on a concrete step nearby.  She denies loss of consciousness or blood thinner use.  She reports being seen here by her primary care as well as one of her family members for assessment via CT scan of the head.  She denies any visual disturbances from baseline, gait abnormalities, weakness/sensory deficits from baseline, slurring of the speech, drooping of the face.  She reports headache that has been present for the past few days of which has not been experienced today at all.  Past medical history significant for peritoneal carcinoma, lung cancer, diverticulitis, history of tobacco use  Home Medications Prior to Admission medications   Medication Sig Start Date End Date Taking? Authorizing Provider  calcium carbonate (OS-CAL) 600 MG TABS Take 600 mg by mouth 2 (two) times daily with a meal.    [provider]  calcium carbonate (TUMS - DOSED IN MG ELEMENTAL CALCIUM) 500 MG chewable tablet Chew 1 tablet by mouth as needed for heartburn.    [provider]  cholecalciferol (VITAMIN D) 1000 UNITS tablet Take 1,000 Units by mouth daily.    [provider]  fish oil-omega-3 fatty acids 1000 MG capsule Take 4,800 mg by mouth daily.     [provider]  levothyroxine (SYNTHROID, LEVOTHROID) 50 MCG tablet 50 mcg Daily. 03/06/12   [provider]  Multiple Vitamins-Minerals (MULTIVITAMIN WITH  MINERALS) tablet Take 1 tablet by mouth daily.    [provider]  Wheat Dextrin (BENEFIBER PO) Take 1 packet by mouth daily.    [provider]      Allergies    Aleve [naproxen sodium], Belladonna alkaloids, Ciprofloxacin, Other, Oxycodone-acetaminophen, Percocet [oxycodone-acetaminophen], Anzemet [dolasetron], Oxycodone, Oxycontin [oxycodone hcl], Sulfamethoxazole-trimethoprim, and Vicodin [hydrocodone-acetaminophen]    Review of Systems   Review of Systems  All other systems reviewed and are negative.   Physical Exam Updated Vital Signs BP (!) 180/81 (BP Location: Right Arm)   Pulse 93   Temp 98 F (36.7 C) (Temporal)   Resp 20   Ht 5\' 3"  (1.6 m)   Wt 55.7 kg   SpO2 99%   BMI 21.75 kg/m  Physical Exam Vitals and nursing note reviewed.  Constitutional:      General: She is not in acute distress.    Appearance: She is well-developed.  HENT:     Head: Normocephalic and atraumatic.  Eyes:     Conjunctiva/sclera: Conjunctivae normal.  Cardiovascular:     Rate and Rhythm: Normal rate and regular rhythm.     Heart sounds: No murmur heard. Pulmonary:     Effort: Pulmonary effort is normal. No respiratory distress.     Breath sounds: Normal breath sounds.  Abdominal:     Palpations: Abdomen is soft.     Tenderness: There is no abdominal tenderness.  Musculoskeletal:        General: No swelling.  Cervical back: Neck supple.  Skin:    General: Skin is warm and dry.     Capillary Refill: Capillary refill takes less than 2 seconds.  Neurological:     Mental Status: She is alert.     Comments: Alert and oriented to self, place, time and event.   Speech is fluent, clear without dysarthria or dysphasia.   Strength 5/5 in upper/lower extremities   Sensation intact in upper/lower extremities   Gait at baseline  No pronator drift.  Normal finger-to-nose and feet tapping.  CN I not tested  CN II grossly intact visual fields bilaterally. Did not  visualize posterior eye.  CN III, IV, VI PERRLA and EOMs intact bilaterally  CN V Intact sensation to sharp and light touch to the face  CN VII facial movements symmetric  CN VIII not tested  CN IX, X no uvula deviation, symmetric rise of soft palate  CN XI 5/5 SCM and trapezius strength bilaterally  CN XII Midline tongue protrusion, symmetric L/R movements   Psychiatric:        Mood and Affect: Mood normal.     ED Results / Procedures / Treatments   Labs (all labs ordered are listed, but only abnormal results are displayed) Labs Reviewed  BASIC METABOLIC PANEL - Abnormal; Notable for the following components:      Result Value   Sodium 133 (*)    Chloride 95 (*)    Glucose, Bld 110 (*)    All other components within normal limits  CBC - Abnormal; Notable for the following components:   WBC 12.8 (*)    RBC 3.86 (*)    Hemoglobin 11.9 (*)    HCT 35.7 (*)    Platelets 480 (*)    All other components within normal limits  TROPONIN I (HIGH SENSITIVITY)    EKG None  Radiology CT Head Wo Contrast  Result Date: 07/09/2022 CLINICAL DATA:  Fall 1 week ago, striking head on concrete EXAM: CT HEAD WITHOUT CONTRAST CT CERVICAL SPINE WITHOUT CONTRAST TECHNIQUE: Multidetector CT imaging of the head and cervical spine was performed following the standard protocol without intravenous contrast. Multiplanar CT image reconstructions of the cervical spine were also generated. RADIATION DOSE REDUCTION: This exam was performed according to the departmental dose-optimization program which includes automated exposure control, adjustment of the mA and/or kV according to patient size and/or use of iterative reconstruction technique. COMPARISON:  Prior CT examinations from 12/08/2016 and 05/18/2012 FINDINGS: CT HEAD FINDINGS Brain: The brainstem, cerebellum, cerebral peduncles, thalami, basal ganglia, basilar cisterns, and ventricular system appear within normal limits. Periventricular white matter and  corona radiata hypodensities favor chronic ischemic microvascular white matter disease. No intracranial hemorrhage, mass lesion, or acute CVA. Vascular: Unremarkable Skull: Unremarkable Sinuses/Orbits: Unremarkable Other: Degenerative spurring of both mandibular condyles. CT CERVICAL SPINE FINDINGS Alignment: 2.5 mm of chronic degenerative anterolisthesis of C7 on T1, no change from 05/18/2012. Skull base and vertebrae: No fracture or acute bony findings. Soft tissues and spinal canal: Bilateral common carotid atherosclerotic calcification. There is some atherosclerotic calcification in the margin of the left vertebral artery. Asymmetric atrophy of the right parotid gland similar to the prior exam. Chronically stable (from 2013) prominence of the left parotid duct. Disc levels: Uncinate and facet spurring contribute to osseous foraminal narrowing on the right at C3-4, C4-5, C5-6, and C6-7; and on the left C3-4, C5-6, and C6-7. Upper chest: Unremarkable Other: No supplemental non-categorized findings. IMPRESSION: 1. No acute intracranial findings or acute cervical spine  findings. 2. Periventricular white matter and corona radiata hypodensities favor chronic ischemic microvascular white matter disease. 3. Cervical spondylosis and degenerative disc disease causing multilevel osseous foraminal narrowing. 4. Atherosclerosis. 5. Asymmetric atrophy of the right parotid gland. Chronically stable prominence of the left parotid duct. Electronically Signed   By: Gaylyn Rong M.D.   On: 07/09/2022 15:39   CT Cervical Spine Wo Contrast  Result Date: 07/09/2022 CLINICAL DATA:  Fall 1 week ago, striking head on concrete EXAM: CT HEAD WITHOUT CONTRAST CT CERVICAL SPINE WITHOUT CONTRAST TECHNIQUE: Multidetector CT imaging of the head and cervical spine was performed following the standard protocol without intravenous contrast. Multiplanar CT image reconstructions of the cervical spine were also generated. RADIATION DOSE  REDUCTION: This exam was performed according to the departmental dose-optimization program which includes automated exposure control, adjustment of the mA and/or kV according to patient size and/or use of iterative reconstruction technique. COMPARISON:  Prior CT examinations from 12/08/2016 and 05/18/2012 FINDINGS: CT HEAD FINDINGS Brain: The brainstem, cerebellum, cerebral peduncles, thalami, basal ganglia, basilar cisterns, and ventricular system appear within normal limits. Periventricular white matter and corona radiata hypodensities favor chronic ischemic microvascular white matter disease. No intracranial hemorrhage, mass lesion, or acute CVA. Vascular: Unremarkable Skull: Unremarkable Sinuses/Orbits: Unremarkable Other: Degenerative spurring of both mandibular condyles. CT CERVICAL SPINE FINDINGS Alignment: 2.5 mm of chronic degenerative anterolisthesis of C7 on T1, no change from 05/18/2012. Skull base and vertebrae: No fracture or acute bony findings. Soft tissues and spinal canal: Bilateral common carotid atherosclerotic calcification. There is some atherosclerotic calcification in the margin of the left vertebral artery. Asymmetric atrophy of the right parotid gland similar to the prior exam. Chronically stable (from 2013) prominence of the left parotid duct. Disc levels: Uncinate and facet spurring contribute to osseous foraminal narrowing on the right at C3-4, C4-5, C5-6, and C6-7; and on the left C3-4, C5-6, and C6-7. Upper chest: Unremarkable Other: No supplemental non-categorized findings. IMPRESSION: 1. No acute intracranial findings or acute cervical spine findings. 2. Periventricular white matter and corona radiata hypodensities favor chronic ischemic microvascular white matter disease. 3. Cervical spondylosis and degenerative disc disease causing multilevel osseous foraminal narrowing. 4. Atherosclerosis. 5. Asymmetric atrophy of the right parotid gland. Chronically stable prominence of the left  parotid duct. Electronically Signed   By: Gaylyn Rong M.D.   On: 07/09/2022 15:39    Procedures Procedures    Medications Ordered in ED Medications  acetaminophen (TYLENOL) tablet 650 mg (650 mg Oral Given 07/09/22 1533)    ED Course/ Medical Decision Making/ A&P                           Medical Decision Making Amount and/or Complexity of Data Reviewed Labs: ordered. Radiology: ordered.  Risk OTC drugs.   This patient presents to the ED for concern of fall, this involves an extensive number of treatment options, and is a complaint that carries with it a high risk of complications and morbidity.  The differential diagnosis includes CVA, fracture, strain chest pain, dislocation, salivary damage, spinal cord damage   Co morbidities that complicate the patient evaluation  See HPI   Additional history obtained:  Additional history obtained from EMR External records from outside source obtained and reviewed including hospital records   Lab Tests:  I Ordered, and personally interpreted labs.  The pertinent results include: Leukocytosis of 12.8 which patient has baseline leukocytosis per prior laboratory studies.  Patient also shows mild anemia  with a hemoglobin of 11.9 of which patient has baseline mild anemia.  Thrombocytosis of 480 oh which patient has baseline thrombocytosis from prior studies.  Troponin pending.  BMP pending.   Imaging Studies ordered:  I ordered imaging studies including CT head/C-spine I independently visualized and interpreted imaging which showed no acute intracranial findings or acute cervical findings.  Periventricular white matter and corona hypodensities favor chronic ischemia microvascular white matter disease.  Cervical spondylosis degenerative disc disease causing multilevel osseous foraminal narrowing.  Atherosclerosis.  Asymmetric atrophy of the right parotid gland.  Chronically stable prominence of left parotid duct. I agree with the  radiologist interpretation  Cardiac Monitoring: / EKG:  The patient was maintained on a cardiac monitor.  I personally viewed and interpreted the cardiac monitored which showed an underlying rhythm of: Sinus rhythm with nonspecific ST changes in inferior leads as well as nonspecific T wave abnormalities in inferior and lateral leads.   Consultations Obtained:  N/a   Problem List / ED Course / Critical interventions / Medication management  Fall I ordered medication including Tylenol as ordered in the triage area   Reevaluation of the patient  showed that the patient stayed the same I have reviewed the patients home medicines and have made adjustments as needed   Social Determinants of Health:  Denies tobacco or illicit drug use.   Test / Admission - Considered:  Fall/trauma to head Vitals signs significant for hypertension with a blood pressure of 180/81 which was approximately 200 systolic over 90 diastolic upon initial interview.  Patient stated that she has "whitecoat hypertension" and this is normal for her.  She denied any associated chest pain, dizziness, lightheadedness, headache with associated elevation of blood pressure.. Otherwise within normal range and stable throughout visit. Laboratory/imaging studies significant for: See above Patient CT scans negative for any acute abnormalities from prior fall.  Upon reevaluation of the patient after laboratory studies have been ordered, patient elected to leave prior to resulting of the study given patient has baseline compromised vision of which is worsened when driving at night and she states she has a longer drive home and is concerned about possibly wrecking her car.  Patient's blood pressure came down slightly from prior value of 200 systolic to 180 systolic.  Pending metabolic panel as well as initial troponin.  Patient currently complaining of no symptoms at all so low suspicion for hypertensive emergency.  Patient noted to  have elevated blood pressure when visiting outpatient providers as well as in the emergency department so elevated blood pressure seems most likely chronic finding.  Patient has at home blood pressure cuff of which she takes her blood pressure often.  I discussed with patient at length regarding risks of elevated blood pressure to this degre with premature work-up/discharge and she elected to discharge for follow-up with her primary care.  Recommend follow-up with with primary care as soon as able.  Treatment plan discussed and patient she knowledge understands agreeable to said plan. Worrisome signs and symptoms were discussed with the patient, and the patient acknowledged understanding to return to the ED if noticed. Patient was stable upon discharge.          Final Clinical Impression(s) / ED Diagnoses Final diagnoses:  Fall, initial encounter  Hypertension, unspecified type    Rx / DC Orders ED Discharge Orders     None         Peter Garter, [place] 07/09/22 1723    Mardene Sayer, MD 07/10/22  1234  

## 2022-07-10 ENCOUNTER — Inpatient Hospital Stay: Payer: Medicare HMO | Attending: Nurse Practitioner

## 2022-07-10 ENCOUNTER — Other Ambulatory Visit: Payer: Self-pay

## 2022-07-10 DIAGNOSIS — D519 Vitamin B12 deficiency anemia, unspecified: Secondary | ICD-10-CM | POA: Diagnosis not present

## 2022-07-10 MED ORDER — CYANOCOBALAMIN 1000 MCG/ML IJ SOLN
1000.0000 ug | Freq: Once | INTRAMUSCULAR | Status: AC
  Administered 2022-07-10: 1000 ug via INTRAMUSCULAR
  Filled 2022-07-10: qty 1

## 2022-07-11 ENCOUNTER — Ambulatory Visit: Payer: Medicare HMO

## 2022-07-18 ENCOUNTER — Telehealth: Payer: Self-pay

## 2022-07-18 NOTE — Telephone Encounter (Signed)
     Patient  visit on 12/5  at [place]  Patient is doing ok and has had all medications filled. Patient feels there is no need to follow up with PCP or other provider at this time     Have you been able to follow up with your primary care physician? NA  The patient was or was not able to obtain any needed medicine or equipment. YES  Are there diet recommendations that you are having difficulty following? NA  Patient expresses understanding of discharge instructions and education provided has no other needs at this time. YES   .AL

## 2022-08-13 ENCOUNTER — Inpatient Hospital Stay: Payer: Medicare HMO

## 2022-08-15 ENCOUNTER — Other Ambulatory Visit: Payer: Self-pay

## 2022-08-15 ENCOUNTER — Inpatient Hospital Stay: Payer: Medicare HMO | Attending: Nurse Practitioner

## 2022-08-15 DIAGNOSIS — D519 Vitamin B12 deficiency anemia, unspecified: Secondary | ICD-10-CM | POA: Diagnosis not present

## 2022-08-15 MED ORDER — CYANOCOBALAMIN 1000 MCG/ML IJ SOLN
1000.0000 ug | Freq: Once | INTRAMUSCULAR | Status: AC
  Administered 2022-08-15: 1000 ug via INTRAMUSCULAR
  Filled 2022-08-15: qty 1

## 2022-08-15 NOTE — Patient Instructions (Signed)
Vitamin B12 Deficiency Vitamin B12 deficiency occurs when the body does not have enough of this important vitamin. The body needs this vitamin: To make red blood cells. To make DNA. This is the genetic material inside cells. To help the nerves work properly so they can carry messages from the brain to the body. Vitamin B12 deficiency can cause health problems, such as not having enough red blood cells in the blood (anemia). This can lead to nerve damage if untreated. What are the causes? This condition may be caused by: Not eating enough foods that contain vitamin B12. Not having enough stomach acid and digestive fluids to properly absorb vitamin B12 from the food that you eat. Having certain diseases that make it hard to absorb vitamin B12. These diseases include Crohn's disease, chronic pancreatitis, and cystic fibrosis. An autoimmune disorder in which the body does not make enough of a protein (intrinsic factor) within the stomach, resulting in not enough absorption of vitamin B12. Having a surgery in which part of the stomach or small intestine is removed. Taking certain medicines that make it hard for the body to absorb vitamin B12. These include: Heartburn medicines, such as antacids and proton pump inhibitors. Some medicines that are used to treat diabetes. What increases the risk? The following factors may make you more likely to develop a vitamin B12 deficiency: Being an older adult. Eating a vegetarian or vegan diet that does not include any foods that come from animals. Eating a poor diet while you are pregnant. Taking certain medicines. Having alcoholism. What are the signs or symptoms? In some cases, there are no symptoms of this condition. If the condition leads to anemia or nerve damage, various symptoms may occur, such as: Weakness. Tiredness (fatigue). Loss of appetite. Numbness or tingling in your hands and feet. Redness and burning of the tongue. Depression,  confusion, or memory problems. Trouble walking. If anemia is severe, symptoms can include: Shortness of breath. Dizziness. Rapid heart rate. How is this diagnosed? This condition may be diagnosed with a blood test to measure the level of vitamin B12 in your blood. You may also have other tests, including: A group of tests that measure certain characteristics of blood cells (complete blood count, CBC). A blood test to measure intrinsic factor. A procedure where a thin tube with a camera on the end is used to look into your stomach or intestines (endoscopy). Other tests may be needed to discover the cause of the deficiency. How is this treated? Treatment for this condition depends on the cause. This condition may be treated by: Changing your eating and drinking habits, such as: Eating more foods that contain vitamin B12. Drinking less alcohol or no alcohol. Getting vitamin B12 injections. Taking vitamin B12 supplements by mouth (orally). Your health care provider will tell you which dose is best for you. Follow these instructions at home: Eating and drinking  Include foods in your diet that come from animals and contain a lot of vitamin B12. These include: Meats and poultry. This includes beef, pork, chicken, turkey, and organ meats, such as liver. Seafood. This includes clams, rainbow trout, salmon, tuna, and haddock. Eggs. Dairy foods such as milk, yogurt, and cheese. Eat foods that have vitamin B12 added to them (are fortified), such as ready-to-eat breakfast cereals. Check the label on the package to see if a food is fortified. The items listed above may not be a complete list of foods and beverages you can eat and drink. Contact a dietitian for   more information. Alcohol use Do not drink alcohol if: Your health care provider tells you not to drink. You are pregnant, may be pregnant, or are planning to become pregnant. If you drink alcohol: Limit how much you have to: 0-1 drink a  day for women. 0-2 drinks a day for men. Know how much alcohol is in your drink. In the [place] one drink equals one 12 oz bottle of beer (355 mL), one 5 oz glass of wine (148 mL), or one 1 oz glass of hard liquor (44 mL). General instructions Get vitamin B12 injections if told to by your health care provider. Take supplements only as told by your health care provider. Follow the directions carefully. Keep all follow-up visits. This is important. Contact a health care provider if: Your symptoms come back. Your symptoms get worse or do not improve with treatment. Get help right away: You develop shortness of breath. You have a rapid heart rate. You have chest pain. You become dizzy or you faint. These symptoms may be an emergency. Get help right away. Call 911. Do not wait to see if the symptoms will go away. Do not drive yourself to the hospital. Summary Vitamin B12 deficiency occurs when the body does not have enough of this important vitamin. Common causes include not eating enough foods that contain vitamin B12, not being able to absorb vitamin B12 from the food that you eat, having a surgery in which part of the stomach or small intestine is removed, or taking certain medicines. Eat foods that have vitamin B12 in them. Treatment may include making a change in the way you eat and drink, getting vitamin B12 injections, or taking vitamin B12 supplements. This information is not intended to replace advice given to you by your health care provider. Make sure you discuss any questions you have with your health care provider. Document Revised: 03/16/2021 Document Reviewed: 03/16/2021 Elsevier Patient Education  2023 Elsevier Inc.  

## 2022-09-02 ENCOUNTER — Other Ambulatory Visit: Payer: Self-pay | Admitting: Internal Medicine

## 2022-09-02 DIAGNOSIS — Z1231 Encounter for screening mammogram for malignant neoplasm of breast: Secondary | ICD-10-CM

## 2022-09-13 ENCOUNTER — Inpatient Hospital Stay: Payer: Medicare HMO

## 2022-09-13 ENCOUNTER — Inpatient Hospital Stay: Payer: Medicare HMO | Attending: Nurse Practitioner

## 2022-09-13 ENCOUNTER — Other Ambulatory Visit: Payer: Self-pay

## 2022-09-13 VITALS — BP 170/79 | HR 78 | Temp 98.4°F | Resp 18

## 2022-09-13 DIAGNOSIS — D72829 Elevated white blood cell count, unspecified: Secondary | ICD-10-CM

## 2022-09-13 DIAGNOSIS — D519 Vitamin B12 deficiency anemia, unspecified: Secondary | ICD-10-CM

## 2022-09-13 LAB — CBC WITH DIFFERENTIAL (CANCER CENTER ONLY)
Abs Immature Granulocytes: 0.07 10*3/uL (ref 0.00–0.07)
Basophils Absolute: 0.1 10*3/uL (ref 0.0–0.1)
Basophils Relative: 1 %
Eosinophils Absolute: 0.2 10*3/uL (ref 0.0–0.5)
Eosinophils Relative: 2 %
HCT: 33.9 % — ABNORMAL LOW (ref 36.0–46.0)
Hemoglobin: 11.7 g/dL — ABNORMAL LOW (ref 12.0–15.0)
Immature Granulocytes: 1 %
Lymphocytes Relative: 38 %
Lymphs Abs: 5.1 10*3/uL — ABNORMAL HIGH (ref 0.7–4.0)
MCH: 31.7 pg (ref 26.0–34.0)
MCHC: 34.5 g/dL (ref 30.0–36.0)
MCV: 91.9 fL (ref 80.0–100.0)
Monocytes Absolute: 0.8 10*3/uL (ref 0.1–1.0)
Monocytes Relative: 6 %
Neutro Abs: 7.1 10*3/uL (ref 1.7–7.7)
Neutrophils Relative %: 52 %
Platelet Count: 581 10*3/uL — ABNORMAL HIGH (ref 150–400)
RBC: 3.69 MIL/uL — ABNORMAL LOW (ref 3.87–5.11)
RDW: 13.4 % (ref 11.5–15.5)
WBC Count: 13.4 10*3/uL — ABNORMAL HIGH (ref 4.0–10.5)
nRBC: 0 % (ref 0.0–0.2)

## 2022-09-13 MED ORDER — CYANOCOBALAMIN 1000 MCG/ML IJ SOLN
1000.0000 ug | Freq: Once | INTRAMUSCULAR | Status: AC
  Administered 2022-09-13: 1000 ug via INTRAMUSCULAR
  Filled 2022-09-13: qty 1

## 2022-09-13 NOTE — Patient Instructions (Signed)

## 2022-09-16 ENCOUNTER — Telehealth: Payer: Self-pay

## 2022-09-16 NOTE — Telephone Encounter (Addendum)
Called to relay message below. Patient voiced full understanding    ----- Message from Alla Feeling, NP sent at 09/15/2022  7:58 AM EST ----- Please call pt, let her know WBC and mild anemia are stable, plt and lymphs slightly higher than 2022-2023. See how she's doing. If ok no change, I recommend to continue observation, will add lab in 12/2022 with injection, and see her in August as scheduled.   Thanks, Regan Rakers, NP

## 2022-09-24 DIAGNOSIS — H04123 Dry eye syndrome of bilateral lacrimal glands: Secondary | ICD-10-CM | POA: Diagnosis not present

## 2022-09-24 DIAGNOSIS — H26491 Other secondary cataract, right eye: Secondary | ICD-10-CM | POA: Diagnosis not present

## 2022-09-24 DIAGNOSIS — H0100A Unspecified blepharitis right eye, upper and lower eyelids: Secondary | ICD-10-CM | POA: Diagnosis not present

## 2022-09-24 DIAGNOSIS — H52203 Unspecified astigmatism, bilateral: Secondary | ICD-10-CM | POA: Diagnosis not present

## 2022-09-24 DIAGNOSIS — H43813 Vitreous degeneration, bilateral: Secondary | ICD-10-CM | POA: Diagnosis not present

## 2022-09-26 DIAGNOSIS — E538 Deficiency of other specified B group vitamins: Secondary | ICD-10-CM | POA: Diagnosis not present

## 2022-09-26 DIAGNOSIS — R7303 Prediabetes: Secondary | ICD-10-CM | POA: Diagnosis not present

## 2022-10-03 DIAGNOSIS — E559 Vitamin D deficiency, unspecified: Secondary | ICD-10-CM | POA: Diagnosis not present

## 2022-10-03 DIAGNOSIS — R7303 Prediabetes: Secondary | ICD-10-CM | POA: Diagnosis not present

## 2022-10-03 DIAGNOSIS — E039 Hypothyroidism, unspecified: Secondary | ICD-10-CM | POA: Diagnosis not present

## 2022-10-03 DIAGNOSIS — D75839 Thrombocytosis, unspecified: Secondary | ICD-10-CM | POA: Diagnosis not present

## 2022-10-03 DIAGNOSIS — Z8589 Personal history of malignant neoplasm of other organs and systems: Secondary | ICD-10-CM | POA: Diagnosis not present

## 2022-10-03 DIAGNOSIS — Q8901 Asplenia (congenital): Secondary | ICD-10-CM | POA: Diagnosis not present

## 2022-10-08 DIAGNOSIS — H26491 Other secondary cataract, right eye: Secondary | ICD-10-CM | POA: Diagnosis not present

## 2022-10-09 ENCOUNTER — Ambulatory Visit
Admission: RE | Admit: 2022-10-09 | Discharge: 2022-10-09 | Disposition: A | Discharge status: Home / Self Care | Payer: Medicare HMO | Source: Ambulatory Visit | Attending: Internal Medicine | Admitting: Internal Medicine

## 2022-10-09 DIAGNOSIS — Z1231 Encounter for screening mammogram for malignant neoplasm of breast: Secondary | ICD-10-CM | POA: Diagnosis not present

## 2022-10-10 ENCOUNTER — Other Ambulatory Visit: Payer: Self-pay

## 2022-10-10 ENCOUNTER — Inpatient Hospital Stay: Payer: Medicare HMO | Attending: Nurse Practitioner

## 2022-10-10 VITALS — BP 176/87 | HR 84 | Temp 98.6°F | Resp 17

## 2022-10-10 DIAGNOSIS — E538 Deficiency of other specified B group vitamins: Secondary | ICD-10-CM | POA: Insufficient documentation

## 2022-10-10 DIAGNOSIS — D519 Vitamin B12 deficiency anemia, unspecified: Secondary | ICD-10-CM

## 2022-10-10 MED ORDER — CYANOCOBALAMIN 1000 MCG/ML IJ SOLN
1000.0000 ug | Freq: Once | INTRAMUSCULAR | Status: AC
  Administered 2022-10-10: 1000 ug via INTRAMUSCULAR
  Filled 2022-10-10: qty 1

## 2022-10-10 NOTE — Patient Instructions (Signed)

## 2022-10-30 DIAGNOSIS — L299 Pruritus, unspecified: Secondary | ICD-10-CM | POA: Diagnosis not present

## 2022-10-31 DIAGNOSIS — N898 Other specified noninflammatory disorders of vagina: Secondary | ICD-10-CM | POA: Diagnosis not present

## 2022-11-14 ENCOUNTER — Other Ambulatory Visit: Payer: Self-pay

## 2022-11-14 ENCOUNTER — Inpatient Hospital Stay: Payer: Medicare HMO | Attending: Nurse Practitioner

## 2022-11-14 VITALS — BP 183/80 | HR 74 | Temp 98.3°F | Resp 16

## 2022-11-14 DIAGNOSIS — E538 Deficiency of other specified B group vitamins: Secondary | ICD-10-CM | POA: Diagnosis not present

## 2022-11-14 DIAGNOSIS — D519 Vitamin B12 deficiency anemia, unspecified: Secondary | ICD-10-CM

## 2022-11-14 MED ORDER — CYANOCOBALAMIN 1000 MCG/ML IJ SOLN
1000.0000 ug | Freq: Once | INTRAMUSCULAR | Status: AC
  Administered 2022-11-14: 1000 ug via INTRAMUSCULAR
  Filled 2022-11-14: qty 1

## 2022-11-14 NOTE — Progress Notes (Signed)
Patient reports her PCP is aware of her BP being high. She reports it is only ever high at [employer] and is normal when she takes it at home. Her PCP has reportedly told her she does not need any medications to manage her BP.

## 2022-12-02 DIAGNOSIS — D071 Carcinoma in situ of vulva: Secondary | ICD-10-CM | POA: Diagnosis not present

## 2022-12-02 DIAGNOSIS — N9089 Other specified noninflammatory disorders of vulva and perineum: Secondary | ICD-10-CM | POA: Diagnosis not present

## 2022-12-12 ENCOUNTER — Other Ambulatory Visit: Payer: Self-pay | Admitting: *Deleted

## 2022-12-12 ENCOUNTER — Inpatient Hospital Stay: Payer: Medicare HMO

## 2022-12-12 ENCOUNTER — Telehealth: Payer: Self-pay | Admitting: *Deleted

## 2022-12-12 ENCOUNTER — Inpatient Hospital Stay: Payer: Medicare HMO | Attending: Nurse Practitioner

## 2022-12-12 ENCOUNTER — Other Ambulatory Visit: Payer: Self-pay

## 2022-12-12 VITALS — BP 204/71 | HR 70 | Temp 97.8°F | Resp 18

## 2022-12-12 DIAGNOSIS — E538 Deficiency of other specified B group vitamins: Secondary | ICD-10-CM | POA: Diagnosis not present

## 2022-12-12 DIAGNOSIS — D72829 Elevated white blood cell count, unspecified: Secondary | ICD-10-CM

## 2022-12-12 DIAGNOSIS — D519 Vitamin B12 deficiency anemia, unspecified: Secondary | ICD-10-CM

## 2022-12-12 LAB — CBC WITH DIFFERENTIAL (CANCER CENTER ONLY)
Abs Immature Granulocytes: 0.05 10*3/uL (ref 0.00–0.07)
Basophils Absolute: 0.1 10*3/uL (ref 0.0–0.1)
Basophils Relative: 1 %
Eosinophils Absolute: 0.2 10*3/uL (ref 0.0–0.5)
Eosinophils Relative: 2 %
HCT: 34.8 % — ABNORMAL LOW (ref 36.0–46.0)
Hemoglobin: 11.6 g/dL — ABNORMAL LOW (ref 12.0–15.0)
Immature Granulocytes: 0 %
Lymphocytes Relative: 38 %
Lymphs Abs: 5.2 10*3/uL — ABNORMAL HIGH (ref 0.7–4.0)
MCH: 30.9 pg (ref 26.0–34.0)
MCHC: 33.3 g/dL (ref 30.0–36.0)
MCV: 92.8 fL (ref 80.0–100.0)
Monocytes Absolute: 0.9 10*3/uL (ref 0.1–1.0)
Monocytes Relative: 6 %
Neutro Abs: 7.3 10*3/uL (ref 1.7–7.7)
Neutrophils Relative %: 53 %
Platelet Count: 466 10*3/uL — ABNORMAL HIGH (ref 150–400)
RBC: 3.75 MIL/uL — ABNORMAL LOW (ref 3.87–5.11)
RDW: 14.6 % (ref 11.5–15.5)
WBC Count: 13.7 10*3/uL — ABNORMAL HIGH (ref 4.0–10.5)
nRBC: 0 % (ref 0.0–0.2)

## 2022-12-12 LAB — VITAMIN B12: Vitamin B-12: 340 pg/mL (ref 180–914)

## 2022-12-12 MED ORDER — CYANOCOBALAMIN 1000 MCG/ML IJ SOLN
1000.0000 ug | Freq: Once | INTRAMUSCULAR | Status: AC
  Administered 2022-12-12: 1000 ug via INTRAMUSCULAR
  Filled 2022-12-12: qty 1

## 2022-12-12 NOTE — Telephone Encounter (Signed)
Voicemail left for return call. Telephone call to patient to advise labs are stable. If patient continues to have concerns regarding high blood pressure and black stools she can follow up with her GI or PCP. She may also schedule an appt to see Dr. Mosetta Putt next week.

## 2022-12-23 ENCOUNTER — Other Ambulatory Visit: Payer: Self-pay

## 2022-12-23 ENCOUNTER — Telehealth: Payer: Self-pay

## 2022-12-23 NOTE — Telephone Encounter (Signed)
Pt called inquiring about recent WBC lab that was drawn last week.  Pt also stated that her GYN wants to do a biopsy d/t pt has a vaginal rash.  Pt wanted to make Dr. Mosetta Putt aware of her appt scheduled on 12/24/2022 with her GYN to discuss and schedule the biopsy.  Informed pt of what her WBC result was on 12/12/2022.  Pt had no further questions or concerns at this time.

## 2022-12-30 ENCOUNTER — Other Ambulatory Visit: Payer: Self-pay | Admitting: Hematology

## 2022-12-31 ENCOUNTER — Telehealth: Payer: Self-pay

## 2022-12-31 ENCOUNTER — Other Ambulatory Visit: Payer: Self-pay

## 2022-12-31 DIAGNOSIS — D519 Vitamin B12 deficiency anemia, unspecified: Secondary | ICD-10-CM

## 2022-12-31 NOTE — Telephone Encounter (Addendum)
Called patient and relayed message below as per Dr. Mosetta Putt. Patient voiced full understanding.   ----- Message from Malachy Mood, MD sent at 12/30/2022  3:47 PM EDT ----- Dyanne Carrel, please let pt know her B12 level is good, please order B12 level every 6 months under Lacie's name, thanks   Malachy Mood

## 2023-01-02 DIAGNOSIS — D071 Carcinoma in situ of vulva: Secondary | ICD-10-CM | POA: Diagnosis not present

## 2023-01-06 ENCOUNTER — Encounter: Payer: Self-pay | Admitting: Nurse Practitioner

## 2023-01-07 ENCOUNTER — Other Ambulatory Visit: Payer: Self-pay | Admitting: Nurse Practitioner

## 2023-01-07 MED ORDER — CYANOCOBALAMIN 1000 MCG PO CAPS: 1000.0000 ug | ORAL_CAPSULE | Freq: Every day | ORAL | 1 refills | Status: AC

## 2023-01-14 DIAGNOSIS — D071 Carcinoma in situ of vulva: Secondary | ICD-10-CM | POA: Diagnosis not present

## 2023-01-16 ENCOUNTER — Ambulatory Visit: Payer: Medicare HMO

## 2023-01-28 DIAGNOSIS — D071 Carcinoma in situ of vulva: Secondary | ICD-10-CM | POA: Diagnosis not present

## 2023-02-12 ENCOUNTER — Encounter: Payer: Self-pay | Admitting: Nurse Practitioner

## 2023-02-13 ENCOUNTER — Inpatient Hospital Stay: Payer: Medicare HMO

## 2023-03-04 DIAGNOSIS — D071 Carcinoma in situ of vulva: Secondary | ICD-10-CM | POA: Diagnosis not present

## 2023-03-19 NOTE — Progress Notes (Deleted)
Patient Care Team: Merri Brunette, MD as PCP - General (Internal Medicine) Exie Parody, MD (Hematology and Oncology) Hazle Coca, MD as Referring Physician (Obstetrics and Gynecology) Flo Shanks, MD (Otolaryngology) Malachy Mood, MD as Consulting Physician (Hematology) Pollyann Samples, NP as Nurse Practitioner (Nurse Practitioner)   CHIEF COMPLAINT: Follow up lymphocytosis and thrombocytosis   Oncology History   No history exists.     CURRENT THERAPY: B12 injection and Observation   INTERVAL HISTORY Elaine [name] returns for follow up as scheduled. Last seen by me 04/02/21  ROS   Past Medical History:  Diagnosis Date   Acid reflux    Diverticulitis    Hypothyroid    Leukocytosis    Lung cancer (HCC)    Neuropathy    secondary to chemo x 2   Osteopenia    Peritoneal carcinoma (HCC) 2006   with lung met; at [facility]; Dr. Elenora Gamma.      Past Surgical History:  Procedure Laterality Date   ABDOMINAL HYSTERECTOMY  2006   COLONOSCOPY  2012   neg.    OOPHORECTOMY  2006   SPLENECTOMY  2006   TONSILLECTOMY     TOTAL ABDOMINAL HYSTERECTOMY W/ BILATERAL SALPINGOOPHORECTOMY  2006     Outpatient Encounter Medications as of 03/20/2023  Medication Sig   Cyanocobalamin 1000 MCG CAPS Take 1,000 mcg by mouth daily.   calcium carbonate (OS-CAL) 600 MG TABS Take 600 mg by mouth 2 (two) times daily with a meal.   calcium carbonate (TUMS - DOSED IN MG ELEMENTAL CALCIUM) 500 MG chewable tablet Chew 1 tablet by mouth as needed for heartburn.   cholecalciferol (VITAMIN D) 1000 UNITS tablet Take 1,000 Units by mouth daily.   fish oil-omega-3 fatty acids 1000 MG capsule Take 4,800 mg by mouth daily.    levothyroxine (SYNTHROID, LEVOTHROID) 50 MCG tablet 50 mcg Daily.   Multiple Vitamins-Minerals (MULTIVITAMIN WITH MINERALS) tablet Take 1 tablet by mouth daily.   Wheat Dextrin (BENEFIBER PO) Take 1 packet by mouth daily.   No facility-administered encounter medications on file as of  03/20/2023.     There were no vitals filed for this visit. There is no height or weight on file to calculate BMI.   PHYSICAL EXAM GENERAL:alert, no distress and comfortable SKIN: no rash  EYES: sclera clear NECK: without mass LYMPH:  no palpable cervical or supraclavicular lymphadenopathy  LUNGS: clear with normal breathing effort HEART: regular rate & rhythm, no lower extremity edema ABDOMEN: abdomen soft, non-tender and normal bowel sounds NEURO: alert & oriented x 3 with fluent speech, no focal motor/sensory deficits Breast exam:  PAC without erythema    CBC    Component Value Date/Time   WBC 13.7 (H) 12/12/2022 1325   WBC 12.8 (H) 07/09/2022 1602   RBC 3.75 (L) 12/12/2022 1325   HGB 11.6 (L) 12/12/2022 1325   HGB 12.0 01/03/2014 1036   HCT 34.8 (L) 12/12/2022 1325   HCT 36.1 04/02/2021 0927   HCT 36.7 01/03/2014 1036   PLT 466 (H) 12/12/2022 1325   PLT 409 (H) 01/03/2014 1036   MCV 92.8 12/12/2022 1325   MCV 94.9 01/03/2014 1036   MCH 30.9 12/12/2022 1325   MCHC 33.3 12/12/2022 1325   RDW 14.6 12/12/2022 1325   RDW 13.5 01/03/2014 1036   LYMPHSABS 5.2 (H) 12/12/2022 1325   LYMPHSABS 5.7 (H) 01/03/2014 1036   MONOABS 0.9 12/12/2022 1325   MONOABS 0.9 01/03/2014 1036   EOSABS 0.2 12/12/2022 1325   EOSABS 0.3  01/03/2014 1036   BASOSABS 0.1 12/12/2022 1325   BASOSABS 0.1 01/03/2014 1036     CMP     Component Value Date/Time   NA 133 (L) 07/09/2022 1602   NA 139 04/26/2013 1042   K 3.7 07/09/2022 1602   K 4.5 04/26/2013 1042   CL 95 (L) 07/09/2022 1602   CL 104 05/04/2012 1010   CO2 27 07/09/2022 1602   CO2 27 04/26/2013 1042   GLUCOSE 110 (H) 07/09/2022 1602   GLUCOSE 96 04/26/2013 1042   GLUCOSE 98 05/04/2012 1010   BUN 10 07/09/2022 1602   BUN 12.7 04/26/2013 1042   CREATININE 0.62 07/09/2022 1602   CREATININE 0.8 04/26/2013 1042   CALCIUM 9.7 07/09/2022 1602   CALCIUM 9.8 04/26/2013 1042   PROT 7.0 04/26/2013 1042   ALBUMIN 3.5 04/26/2013 1042    AST 16 04/26/2013 1042   ALT 12 04/26/2013 1042   ALKPHOS 45 04/26/2013 1042   BILITOT 0.33 04/26/2013 1042   GFRNONAA >60 07/09/2022 1602     ASSESSMENT & PLAN:Elaine [name] is a 89 y.o. female with    1. Leukocytosis, lymphocytosis, and thrombocytosis likely reactive  -She has chronic mild leukocytosis with lymphocytosis and intermittent thrombocytosis since 2013. Jak2 05/04/12 was negative.  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   -Patient has had mild intermittent lymphocytosis for >7 years. No neutropenia. She has mild anemia Hgb 11.6 - 12. No B symptoms. This is felt to be reactive or related to previous splenectomy, unlikely LGL leukemia.  -She was found to have B12 deficiency, intrinsic factor is normal. On monthly injections, tolerating well with good response    2.  ?Pancreatic lesion  -Found incidentally on MRI 04/10/2021, stable on q6 month surveillance MRIs -Followed by GI Dr. Ewing Schlein, EUS/biopsy has not been recommended yet -She recently had diverticulitis which has resolved.    3. 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 Noland Fordyce at [facility] and discharged from f/u in 2017.  -she reports a non-melanoma skin cancer removed from her head, continue f/u with derm    4. Health maintenance  -last mammo 10/03/21 was negative -She does not plan to have another colonoscopy  -continues walking 2 miles per day, manages yard, and exercises.  She is cautious with activity due to neuropathy (secondary to previous chemo, B12 deficiency, and positional) -Continue healthy active lifestyle   PLAN:  No orders of the defined types were  placed in this encounter.     All questions were answered. The patient knows to call the clinic with any problems, questions or concerns. No barriers to learning were detected. I spent *** counseling the patient face to face. The total time spent in the appointment was *** and more than 50% was on counseling, review of test results, and coordination of care.   Santiago Glad, NP-C @DATE @

## 2023-03-20 ENCOUNTER — Ambulatory Visit: Payer: Medicare HMO

## 2023-03-20 ENCOUNTER — Inpatient Hospital Stay: Payer: Medicare HMO

## 2023-03-20 ENCOUNTER — Telehealth: Payer: Self-pay | Admitting: Nurse Practitioner

## 2023-03-20 ENCOUNTER — Other Ambulatory Visit: Payer: Self-pay

## 2023-03-20 ENCOUNTER — Inpatient Hospital Stay: Payer: Medicare HMO | Admitting: Nurse Practitioner

## 2023-03-20 NOTE — Progress Notes (Signed)
Called patient to see if she was coming for her appointment she was a no show. No answer left message on her VM. Sent message to scheduling to reschedule appointment and labs.

## 2023-03-24 DIAGNOSIS — Z85828 Personal history of other malignant neoplasm of skin: Secondary | ICD-10-CM | POA: Diagnosis not present

## 2023-03-24 DIAGNOSIS — L738 Other specified follicular disorders: Secondary | ICD-10-CM | POA: Diagnosis not present

## 2023-03-24 DIAGNOSIS — D1801 Hemangioma of skin and subcutaneous tissue: Secondary | ICD-10-CM | POA: Diagnosis not present

## 2023-03-24 DIAGNOSIS — L814 Other melanin hyperpigmentation: Secondary | ICD-10-CM | POA: Diagnosis not present

## 2023-03-24 DIAGNOSIS — L570 Actinic keratosis: Secondary | ICD-10-CM | POA: Diagnosis not present

## 2023-03-24 DIAGNOSIS — L821 Other seborrheic keratosis: Secondary | ICD-10-CM | POA: Diagnosis not present

## 2023-03-27 DIAGNOSIS — E559 Vitamin D deficiency, unspecified: Secondary | ICD-10-CM | POA: Diagnosis not present

## 2023-03-27 DIAGNOSIS — E538 Deficiency of other specified B group vitamins: Secondary | ICD-10-CM | POA: Diagnosis not present

## 2023-03-27 DIAGNOSIS — E7800 Pure hypercholesterolemia, unspecified: Secondary | ICD-10-CM | POA: Diagnosis not present

## 2023-03-27 DIAGNOSIS — R7303 Prediabetes: Secondary | ICD-10-CM | POA: Diagnosis not present

## 2023-03-27 DIAGNOSIS — E039 Hypothyroidism, unspecified: Secondary | ICD-10-CM | POA: Diagnosis not present

## 2023-03-31 ENCOUNTER — Other Ambulatory Visit: Payer: Self-pay | Admitting: Nurse Practitioner

## 2023-03-31 DIAGNOSIS — D519 Vitamin B12 deficiency anemia, unspecified: Secondary | ICD-10-CM

## 2023-03-31 NOTE — Progress Notes (Unsigned)
Patient Care Team: Elaine Brunette, MD as PCP - General (Internal Medicine) Elaine Parody, MD (Hematology and Oncology) Elaine Coca, MD as Referring Physician (Obstetrics and Gynecology) Elaine Shanks, MD (Otolaryngology) Elaine Mood, MD as Consulting Physician (Hematology) Elaine Samples, NP as Nurse Practitioner (Nurse Practitioner)   CHIEF COMPLAINT: Follow up lymphocytosis and thrombocytosis   CURRENT THERAPY: B12 injection and Observation   INTERVAL HISTORY Elaine. Elaine [name] returns for follow up as scheduled. Last seen by me 04/02/22. Doing well overall. Diagnosed with vulvar carcinoma in situ, followed by GYN. Remains active. Denies unintentional weight loss, fever, night sweats, bruising/bleeding. She has been taking oral B12 daily since 01/2023 due to high co pay for injections ($66/mo).   ROS  All other systems reviewed and negative  Past Medical History:  Diagnosis Date   Acid reflux    Diverticulitis    Hypothyroid    Leukocytosis    Lung cancer (HCC)    Neuropathy    secondary to chemo x 2   Osteopenia    Peritoneal carcinoma (HCC) 2006   with lung met; at [facility]; Elaine [name].      Past Surgical History:  Procedure Laterality Date   ABDOMINAL HYSTERECTOMY  2006   COLONOSCOPY  2012   neg.    OOPHORECTOMY  2006   SPLENECTOMY  2006   TONSILLECTOMY     TOTAL ABDOMINAL HYSTERECTOMY W/ BILATERAL SALPINGOOPHORECTOMY  2006     Outpatient Encounter Medications as of 04/02/2023  Medication Sig   calcium carbonate (OS-CAL) 600 MG TABS Take 600 mg by mouth 2 (two) times daily with a meal.   calcium carbonate (TUMS - DOSED IN MG ELEMENTAL CALCIUM) 500 MG chewable tablet Chew 1 tablet by mouth as needed for heartburn.   cholecalciferol (VITAMIN D) 1000 UNITS tablet Take 1,000 Units by mouth daily.   Cyanocobalamin 1000 MCG CAPS Take 1,000 mcg by mouth daily.   fish oil-omega-3 fatty acids 1000 MG capsule Take 4,800 mg by mouth daily.    levothyroxine (SYNTHROID,  LEVOTHROID) 50 MCG tablet 50 mcg Daily.   Multiple Vitamins-Minerals (MULTIVITAMIN WITH MINERALS) tablet Take 1 tablet by mouth daily.   Wheat Dextrin (BENEFIBER PO) Take 1 packet by mouth daily.   No facility-administered encounter medications on file as of 04/02/2023.     Today's Vitals   04/02/23 1133 04/02/23 1136 04/02/23 1137  BP:  (!) 214/69 139/79  Pulse:  68   Resp:  16   Temp:  98.2 F (36.8 C)   TempSrc:  Oral   SpO2:  98%   Weight:  121 lb 3.2 oz (55 kg)   PainSc: 0-No pain     Body mass index is 21.47 kg/m.   PHYSICAL EXAM GENERAL:alert, no distress and comfortable SKIN: no rash  EYES: sclera clear NECK: without mass LYMPH:  no palpable cervical or supraclavicular lymphadenopathy  LUNGS: clear with normal breathing effort HEART: regular rate & rhythm, no lower extremity edema ABDOMEN: abdomen soft, non-tender and normal bowel sounds NEURO: alert & oriented x 3 with fluent speech, no focal motor/sensory deficits   CBC    Component Value Date/Time   WBC 13.1 (H) 04/02/2023 1041   WBC 12.8 (H) 07/09/2022 1602   RBC 3.78 (L) 04/02/2023 1041   HGB 11.9 (L) 04/02/2023 1041   HGB 12.0 01/03/2014 1036   HCT 34.8 (L) 04/02/2023 1041   HCT 36.1 04/02/2021 0927   HCT 36.7 01/03/2014 1036   PLT 406 (H) 04/02/2023 1041  PLT 409 (H) 01/03/2014 1036   MCV 92.1 04/02/2023 1041   MCV 94.9 01/03/2014 1036   MCH 31.5 04/02/2023 1041   MCHC 34.2 04/02/2023 1041   RDW 14.2 04/02/2023 1041   RDW 13.5 01/03/2014 1036   LYMPHSABS 4.9 (H) 04/02/2023 1041   LYMPHSABS 5.7 (H) 01/03/2014 1036   MONOABS 0.7 04/02/2023 1041   MONOABS 0.9 01/03/2014 1036   EOSABS 0.1 04/02/2023 1041   EOSABS 0.3 01/03/2014 1036   BASOSABS 0.1 04/02/2023 1041   BASOSABS 0.1 01/03/2014 1036     CMP     Component Value Date/Time   NA 135 04/02/2023 1041   NA 139 04/26/2013 1042   K 4.6 04/02/2023 1041   K 4.5 04/26/2013 1042   CL 100 04/02/2023 1041   CL 104 05/04/2012 1010   CO2 30  04/02/2023 1041   CO2 27 04/26/2013 1042   GLUCOSE 95 04/02/2023 1041   GLUCOSE 96 04/26/2013 1042   GLUCOSE 98 05/04/2012 1010   BUN 13 04/02/2023 1041   BUN 12.7 04/26/2013 1042   CREATININE 0.71 04/02/2023 1041   CREATININE 0.8 04/26/2013 1042   CALCIUM 9.8 04/02/2023 1041   CALCIUM 9.8 04/26/2013 1042   PROT 6.9 04/02/2023 1041   PROT 7.0 04/26/2013 1042   ALBUMIN 4.1 04/02/2023 1041   ALBUMIN 3.5 04/26/2013 1042   AST 16 04/02/2023 1041   AST 16 04/26/2013 1042   ALT 11 04/02/2023 1041   ALT 12 04/26/2013 1042   ALKPHOS 55 04/02/2023 1041   ALKPHOS 45 04/26/2013 1042   BILITOT 0.3 04/02/2023 1041   BILITOT 0.33 04/26/2013 1042   GFRNONAA >60 04/02/2023 1041     ASSESSMENT & PLAN:Elaine [name] is a 89 y.o. female with    1. Leukocytosis, lymphocytosis, and thrombocytosis likely reactive  -She has chronic mild leukocytosis with lymphocytosis and intermittent thrombocytosis since 2013. Jak2 05/04/12 was negative.  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   -Patient has had mild intermittent lymphocytosis for >7 years. No neutropenia. She has mild anemia Hgb 11.6 - 12. No B symptoms. This is felt to be reactive or related to previous splenectomy, unlikely LGL leukemia.  -She was found to have B12 deficiency, intrinsic factor is normal. On monthly injections, tolerating well with good response but switched to oral in 01/2023 due to co-pay -Elaine [name] is clinically doing well.  No B symptoms.  Exam is benign, labs are stable WBC 13.1, Hgb 11.9, PLT 406 -Continue observation -Lab in 6 and 12 months, then follow-up in a year   2.  ?Pancreatic lesion  -Found incidentally on MRI 04/10/2021,  -Stable on surveillance MRIs, last done 03/08/2022 -Followed by GI Dr. Ewing [name]   3. 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 Noland [name] at [facility] and discharged from f/u in 2017.  -she reports a non-melanoma skin cancer removed from her head, continue f/u with derm  -Followed by Derm for recent diagnosis of carcinoma in situ of the vulva   4. Health maintenance  -last mammo 10/03/21 was negative -She does not plan to have another colonoscopy  -continues walking 2 miles per day, manages yard, and exercises.  She is cautious with activity due to neuropathy (secondary to previous chemo, B12  deficiency, and positional) -Continue healthy active lifestyle   PLAN: -Labs reviewed, WBC 13.1, Hgb 11.9, PLT 406 -Continue observation, CBC every 6 months -Continue oral B12, today's level is pending, if low again will recommend restart injections -Continue routine follow-up with PCP, GYN, GI, and Derm  -Follow-up with me in 1 year, or sooner if needed    All questions were answered. The patient knows to call the clinic with any problems, questions or concerns. No barriers to learning were detected.   Santiago Glad, NP-C 04/02/2023

## 2023-04-02 ENCOUNTER — Inpatient Hospital Stay: Payer: Medicare HMO | Admitting: Nurse Practitioner

## 2023-04-02 ENCOUNTER — Inpatient Hospital Stay: Payer: Medicare HMO | Attending: Nurse Practitioner

## 2023-04-02 ENCOUNTER — Encounter: Payer: Self-pay | Admitting: Nurse Practitioner

## 2023-04-02 VITALS — BP 139/79 | HR 68 | Temp 98.2°F | Resp 16 | Wt 121.2 lb

## 2023-04-02 DIAGNOSIS — E538 Deficiency of other specified B group vitamins: Secondary | ICD-10-CM | POA: Insufficient documentation

## 2023-04-02 DIAGNOSIS — Z85118 Personal history of other malignant neoplasm of bronchus and lung: Secondary | ICD-10-CM | POA: Diagnosis not present

## 2023-04-02 DIAGNOSIS — D519 Vitamin B12 deficiency anemia, unspecified: Secondary | ICD-10-CM

## 2023-04-02 DIAGNOSIS — D649 Anemia, unspecified: Secondary | ICD-10-CM | POA: Diagnosis not present

## 2023-04-02 DIAGNOSIS — D72829 Elevated white blood cell count, unspecified: Secondary | ICD-10-CM

## 2023-04-02 DIAGNOSIS — D071 Carcinoma in situ of vulva: Secondary | ICD-10-CM | POA: Diagnosis not present

## 2023-04-02 DIAGNOSIS — Z90710 Acquired absence of both cervix and uterus: Secondary | ICD-10-CM | POA: Insufficient documentation

## 2023-04-02 DIAGNOSIS — Z85828 Personal history of other malignant neoplasm of skin: Secondary | ICD-10-CM | POA: Diagnosis not present

## 2023-04-02 DIAGNOSIS — Z9221 Personal history of antineoplastic chemotherapy: Secondary | ICD-10-CM | POA: Diagnosis not present

## 2023-04-02 DIAGNOSIS — G620 Drug-induced polyneuropathy: Secondary | ICD-10-CM | POA: Diagnosis not present

## 2023-04-02 DIAGNOSIS — Z90722 Acquired absence of ovaries, bilateral: Secondary | ICD-10-CM | POA: Insufficient documentation

## 2023-04-02 DIAGNOSIS — D75839 Thrombocytosis, unspecified: Secondary | ICD-10-CM | POA: Insufficient documentation

## 2023-04-02 DIAGNOSIS — Z9081 Acquired absence of spleen: Secondary | ICD-10-CM | POA: Diagnosis not present

## 2023-04-02 DIAGNOSIS — D72820 Lymphocytosis (symptomatic): Secondary | ICD-10-CM | POA: Diagnosis not present

## 2023-04-02 LAB — CMP (CANCER CENTER ONLY)
ALT: 11 U/L (ref 0–44)
AST: 16 U/L (ref 15–41)
Albumin: 4.1 g/dL (ref 3.5–5.0)
Alkaline Phosphatase: 55 U/L (ref 38–126)
Anion gap: 5 (ref 5–15)
BUN: 13 mg/dL (ref 8–23)
CO2: 30 mmol/L (ref 22–32)
Calcium: 9.8 mg/dL (ref 8.9–10.3)
Chloride: 100 mmol/L (ref 98–111)
Creatinine: 0.71 mg/dL (ref 0.44–1.00)
GFR, Estimated: >60 mL/min (ref >60)
Glucose, Bld: 95 mg/dL (ref 70–99)
Potassium: 4.6 mmol/L (ref 3.5–5.1)
Sodium: 135 mmol/L (ref 135–145)
Total Bilirubin: 0.3 mg/dL (ref 0.3–1.2)
Total Protein: 6.9 g/dL (ref 6.5–8.1)

## 2023-04-02 LAB — CBC WITH DIFFERENTIAL (CANCER CENTER ONLY)
Abs Immature Granulocytes: 0.05 10*3/uL (ref 0.00–0.07)
Basophils Absolute: 0.1 10*3/uL (ref 0.0–0.1)
Basophils Relative: 1 %
Eosinophils Absolute: 0.1 10*3/uL (ref 0.0–0.5)
Eosinophils Relative: 1 %
HCT: 34.8 % — ABNORMAL LOW (ref 36.0–46.0)
Hemoglobin: 11.9 g/dL — ABNORMAL LOW (ref 12.0–15.0)
Immature Granulocytes: 0 %
Lymphocytes Relative: 37 %
Lymphs Abs: 4.9 10*3/uL — ABNORMAL HIGH (ref 0.7–4.0)
MCH: 31.5 pg (ref 26.0–34.0)
MCHC: 34.2 g/dL (ref 30.0–36.0)
MCV: 92.1 fL (ref 80.0–100.0)
Monocytes Absolute: 0.7 10*3/uL (ref 0.1–1.0)
Monocytes Relative: 5 %
Neutro Abs: 7.2 10*3/uL (ref 1.7–7.7)
Neutrophils Relative %: 56 %
Platelet Count: 406 10*3/uL — ABNORMAL HIGH (ref 150–400)
RBC: 3.78 MIL/uL — ABNORMAL LOW (ref 3.87–5.11)
RDW: 14.2 % (ref 11.5–15.5)
WBC Count: 13.1 10*3/uL — ABNORMAL HIGH (ref 4.0–10.5)
nRBC: 0 % (ref 0.0–0.2)

## 2023-04-02 LAB — VITAMIN B12: Vitamin B-12: 678 pg/mL (ref 180–914)

## 2023-04-03 ENCOUNTER — Encounter: Payer: Self-pay | Admitting: Registered Nurse

## 2023-04-03 ENCOUNTER — Telehealth: Payer: Self-pay

## 2023-04-03 DIAGNOSIS — Z23 Encounter for immunization: Secondary | ICD-10-CM | POA: Diagnosis not present

## 2023-04-03 DIAGNOSIS — R7303 Prediabetes: Secondary | ICD-10-CM | POA: Diagnosis not present

## 2023-04-03 DIAGNOSIS — E559 Vitamin D deficiency, unspecified: Secondary | ICD-10-CM | POA: Diagnosis not present

## 2023-04-03 DIAGNOSIS — E538 Deficiency of other specified B group vitamins: Secondary | ICD-10-CM | POA: Diagnosis not present

## 2023-04-03 DIAGNOSIS — Z8589 Personal history of malignant neoplasm of other organs and systems: Secondary | ICD-10-CM | POA: Diagnosis not present

## 2023-04-03 DIAGNOSIS — M810 Age-related osteoporosis without current pathological fracture: Secondary | ICD-10-CM | POA: Diagnosis not present

## 2023-04-03 DIAGNOSIS — E039 Hypothyroidism, unspecified: Secondary | ICD-10-CM | POA: Diagnosis not present

## 2023-04-03 DIAGNOSIS — Q8901 Asplenia (congenital): Secondary | ICD-10-CM | POA: Diagnosis not present

## 2023-04-03 DIAGNOSIS — Z0000 Encounter for general adult medical examination without abnormal findings: Secondary | ICD-10-CM | POA: Diagnosis not present

## 2023-04-03 NOTE — Telephone Encounter (Signed)
Pt advised with Vu and agreed to plan

## 2023-04-03 NOTE — Telephone Encounter (Signed)
-----   Message from Nurse Webb Silversmith sent at 04/03/2023 10:21 AM EDT -----  ----- Message ----- From: Pollyann Samples, NP Sent: 04/02/2023  10:46 PM EDT To: Verlee Rossetti, CMA; [facility]  Please let pt know B12 level is very good, she can continue oral B12.  Thanks, Clayborn Heron NP

## 2023-04-04 ENCOUNTER — Telehealth: Payer: Self-pay | Admitting: Nurse Practitioner

## 2023-04-04 ENCOUNTER — Telehealth: Payer: Self-pay

## 2023-04-04 NOTE — Telephone Encounter (Addendum)
Called patient and relayed message below as per Santiago Glad NP. Patient voiced full understanding.   ----- Message from Pollyann Samples sent at 04/02/2023 10:46 PM EDT ----- Please let pt know B12 level is very good, she can continue oral B12.  Thanks, Clayborn Heron NP

## 2023-05-05 DIAGNOSIS — N9089 Other specified noninflammatory disorders of vulva and perineum: Secondary | ICD-10-CM | POA: Diagnosis not present

## 2023-05-06 DIAGNOSIS — N9089 Other specified noninflammatory disorders of vulva and perineum: Secondary | ICD-10-CM | POA: Diagnosis not present

## 2023-08-28 DIAGNOSIS — D071 Carcinoma in situ of vulva: Secondary | ICD-10-CM | POA: Diagnosis not present

## 2023-09-30 ENCOUNTER — Inpatient Hospital Stay: Payer: Medicare HMO | Attending: Nurse Practitioner

## 2023-09-30 DIAGNOSIS — D519 Vitamin B12 deficiency anemia, unspecified: Secondary | ICD-10-CM

## 2023-09-30 DIAGNOSIS — D75839 Thrombocytosis, unspecified: Secondary | ICD-10-CM | POA: Diagnosis not present

## 2023-09-30 DIAGNOSIS — D72829 Elevated white blood cell count, unspecified: Secondary | ICD-10-CM | POA: Insufficient documentation

## 2023-09-30 DIAGNOSIS — D72820 Lymphocytosis (symptomatic): Secondary | ICD-10-CM | POA: Diagnosis not present

## 2023-09-30 LAB — CBC WITH DIFFERENTIAL (CANCER CENTER ONLY)
Abs Immature Granulocytes: 0.09 10*3/uL — ABNORMAL HIGH (ref 0.00–0.07)
Basophils Absolute: 0.1 10*3/uL (ref 0.0–0.1)
Basophils Relative: 1 %
Eosinophils Absolute: 0.2 10*3/uL (ref 0.0–0.5)
Eosinophils Relative: 2 %
HCT: 35.1 % — ABNORMAL LOW (ref 36.0–46.0)
Hemoglobin: 12 g/dL (ref 12.0–15.0)
Immature Granulocytes: 1 %
Lymphocytes Relative: 33 %
Lymphs Abs: 4.9 10*3/uL — ABNORMAL HIGH (ref 0.7–4.0)
MCH: 31.3 pg (ref 26.0–34.0)
MCHC: 34.2 g/dL (ref 30.0–36.0)
MCV: 91.6 fL (ref 80.0–100.0)
Monocytes Absolute: 0.7 10*3/uL (ref 0.1–1.0)
Monocytes Relative: 5 %
Neutro Abs: 8.8 10*3/uL — ABNORMAL HIGH (ref 1.7–7.7)
Neutrophils Relative %: 58 %
Platelet Count: 454 10*3/uL — ABNORMAL HIGH (ref 150–400)
RBC: 3.83 MIL/uL — ABNORMAL LOW (ref 3.87–5.11)
RDW: 13.7 % (ref 11.5–15.5)
WBC Count: 14.9 10*3/uL — ABNORMAL HIGH (ref 4.0–10.5)
nRBC: 0 % (ref 0.0–0.2)

## 2023-09-30 LAB — VITAMIN B12: Vitamin B-12: 501 pg/mL (ref 180–914)

## 2023-10-01 ENCOUNTER — Encounter: Payer: Self-pay | Admitting: Nurse Practitioner

## 2023-10-16 DIAGNOSIS — E559 Vitamin D deficiency, unspecified: Secondary | ICD-10-CM | POA: Diagnosis not present

## 2023-10-16 DIAGNOSIS — E039 Hypothyroidism, unspecified: Secondary | ICD-10-CM | POA: Diagnosis not present

## 2023-11-21 ENCOUNTER — Other Ambulatory Visit: Payer: Self-pay | Admitting: Registered Nurse

## 2023-11-21 DIAGNOSIS — Z1231 Encounter for screening mammogram for malignant neoplasm of breast: Secondary | ICD-10-CM

## 2023-12-02 ENCOUNTER — Encounter: Payer: Self-pay | Admitting: Nurse Practitioner

## 2023-12-02 ENCOUNTER — Ambulatory Visit
Admission: RE | Admit: 2023-12-02 | Discharge: 2023-12-02 | Disposition: A | Discharge status: Home / Self Care | Source: Ambulatory Visit | Attending: Registered Nurse | Admitting: Registered Nurse

## 2023-12-02 DIAGNOSIS — Z1231 Encounter for screening mammogram for malignant neoplasm of breast: Secondary | ICD-10-CM

## 2023-12-03 DIAGNOSIS — Z90710 Acquired absence of both cervix and uterus: Secondary | ICD-10-CM | POA: Diagnosis not present

## 2023-12-03 DIAGNOSIS — Z1211 Encounter for screening for malignant neoplasm of colon: Secondary | ICD-10-CM | POA: Diagnosis not present

## 2023-12-03 DIAGNOSIS — M8580 Other specified disorders of bone density and structure, unspecified site: Secondary | ICD-10-CM | POA: Diagnosis not present

## 2023-12-03 DIAGNOSIS — Z780 Asymptomatic menopausal state: Secondary | ICD-10-CM | POA: Diagnosis not present

## 2023-12-03 DIAGNOSIS — Z1231 Encounter for screening mammogram for malignant neoplasm of breast: Secondary | ICD-10-CM | POA: Diagnosis not present

## 2023-12-03 DIAGNOSIS — Z1330 Encounter for screening examination for mental health and behavioral disorders, unspecified: Secondary | ICD-10-CM | POA: Diagnosis not present

## 2023-12-03 DIAGNOSIS — D071 Carcinoma in situ of vulva: Secondary | ICD-10-CM | POA: Diagnosis not present

## 2023-12-03 DIAGNOSIS — Z01419 Encounter for gynecological examination (general) (routine) without abnormal findings: Secondary | ICD-10-CM | POA: Diagnosis not present

## 2024-02-11 ENCOUNTER — Telehealth: Payer: Self-pay | Admitting: Nurse Practitioner

## 2024-02-11 NOTE — Telephone Encounter (Signed)
 Called to reschedule patient appointment to due provider pal  request. I talked  to patient and they are aware of the changes that was made to the upcoming appointment

## 2024-03-24 ENCOUNTER — Other Ambulatory Visit: Payer: Self-pay | Admitting: Nurse Practitioner

## 2024-03-24 ENCOUNTER — Other Ambulatory Visit: Payer: Self-pay

## 2024-03-24 DIAGNOSIS — D519 Vitamin B12 deficiency anemia, unspecified: Secondary | ICD-10-CM

## 2024-03-24 DIAGNOSIS — D72829 Elevated white blood cell count, unspecified: Secondary | ICD-10-CM

## 2024-03-24 NOTE — Progress Notes (Signed)
 Patient Care Team: Clarice Nottingham, MD as PCP - General (Internal Medicine) Twana Jeneal DASEN, MD (Hematology and Oncology) Vonzell Savant, MD as Referring Physician (Obstetrics and Gynecology) Arlana Arnt, MD (Otolaryngology) Lanny Callander, MD as Consulting Physician (Hematology) Burton, Lacie K, NP as Nurse Practitioner (Nurse Practitioner)  Clinic Day:  03/25/2024  Referring physician: Clarice Nottingham, MD  ASSESSMENT & PLAN:   Assessment & Plan: Leukocytosis  Leukocytosis, lymphocytosis, and thrombocytosis likely reactive  -She has chronic mild leukocytosis with lymphocytosis and intermittent thrombocytosis since 2013. Jak2 05/04/12 was negative.  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   -Patient has had mild intermittent lymphocytosis for >7 years. No neutropenia. She has mild anemia Hgb 11.6 - 12. No B symptoms. This is felt to be reactive or related to previous splenectomy, unlikely LGL leukemia.  -She was found to have B12 deficiency, intrinsic factor is normal. On monthly injections, tolerating well with good response but switched to oral in 01/2023 due to co-pay -Ms Joslin is clinically doing well.  No B symptoms.  Exam is benign, labs are stable WBC 13.5, Hgb 11.9, PLT 453. -Continue observation -Lab in 6 and 12 months, then follow-up in a year   Mild anemia Hgb 11.9 and HCT 24.9.  B12 levels pending.  She takes oral B12 1000 mcg daily.  Recommend continuation of oral B12.  Recheck labs in 6 months and again in 12 months.  Treat persistent deficiency as indicated.  Leukocytosis WBC 13.5 and ANC 8.4.  She denies fever, chills, night sweats, or unintentional weight loss.  Will continue to monitor labs at 6 and 12 months.  Plan Labs reviewed. -Mild anemia with Hgb 11.9 and HCT 25.9.  Mild leukocytosis with WBC 13.5 and ANC 8.4.  Will monitor labs in 6  and 12 months and intervene as indicated. - CMP is unremarkable other than sodium of 132. Monitor labs at 6 and 12 months.  Follow-up in 1 year. Strict return instructions were reviewed.  The patient understands the plans discussed today and is in agreement with them.  She knows to contact our office if she develops concerns prior to her next appointment.  I provided 20 minutes of face-to-face time during this encounter and > 50% was spent counseling as documented under my assessment and plan.    Powell FORBES Lessen, NP  [facility] - A DEPT OF [facility] [address] [place] [address] Dept: [phone] Dept Fax: [phone]   No orders of the defined types were placed in this encounter.     CHIEF COMPLAINT:  CC: Leukocytosis  Current Treatment: B12 injection and observation  INTERVAL HISTORY:  Elaine [name] is here today for repeat clinical assessment.  She was last seen by Lacie, NP, 04/01/2024.  Reports feeling well in general.  She denies chest pain, chest pressure, or shortness of breath. She denies headaches or visual disturbances. She denies abdominal pain, nausea, vomiting, or changes in bowel or bladder habits.  She denies fevers or chills.  She denies night sweats or unintentional weight loss.  She denies pain. Her appetite is good. Her weight has increased 3 pounds over last year.  I have reviewed the past medical history, past surgical history, social history and family history with the patient and they are unchanged from previous note.  ALLERGIES:  is allergic to aleve [naproxen sodium],  belladonna alkaloids, ciprofloxacin , other, oxycodone-acetaminophen , percocet [oxycodone-acetaminophen ], anzemet [dolasetron], oxycodone, oxycontin [oxycodone hcl], sulfamethoxazole-trimethoprim, and vicodin [hydrocodone-acetaminophen ].  MEDICATIONS:  Current Outpatient Medications  Medication Sig Dispense Refill   calcium  carbonate (OS-CAL) 600 MG TABS Take 600 mg by mouth 2 (two) times daily with a meal.     calcium carbonate (TUMS - DOSED IN MG ELEMENTAL CALCIUM) 500 MG chewable tablet Chew 1 tablet by mouth as needed for heartburn.     cholecalciferol (VITAMIN D) 1000 UNITS tablet Take 1,000 Units by mouth daily.     Cyanocobalamin  1000 MCG CAPS Take 1,000 mcg by mouth daily. 90 capsule 1   fish oil-omega-3 fatty acids 1000 MG capsule Take 4,800 mg by mouth daily.      levothyroxine (SYNTHROID, LEVOTHROID) 50 MCG tablet 50 mcg Daily.     Multiple Vitamins-Minerals (MULTIVITAMIN WITH MINERALS) tablet Take 1 tablet by mouth daily.     Wheat Dextrin (BENEFIBER PO) Take 1 packet by mouth daily.     No current facility-administered medications for this visit.     REVIEW OF SYSTEMS:   Constitutional: Denies fevers, chills or abnormal weight loss Eyes: Denies blurriness of vision Ears, nose, mouth, throat, and face: Denies mucositis or sore throat Respiratory: Denies cough, dyspnea or wheezes Cardiovascular: Denies palpitation, chest discomfort or lower extremity swelling Gastrointestinal:  Denies nausea, heartburn or change in bowel habits Skin: Denies abnormal skin rashes Lymphatics: Denies new lymphadenopathy or easy bruising Neurological:Denies numbness, tingling or new weaknesses Behavioral/Psych: Mood is stable, no new changes  All other systems were reviewed with the patient and are negative.   VITALS:   Today's Vitals   03/25/24 1118 03/25/24 1128  BP: 138/74   Pulse: 74   Resp: 17   Temp: 97.8 F (36.6 C)   SpO2: 99%   Weight: 124 lb 3.2 oz (56.3 kg)   Height: 5' 3 (1.6 m)   PainSc: 0-No pain 0-No pain   Body mass index is 22 kg/m.   Wt Readings from Last 3 Encounters:  03/25/24 124 lb 3.2 oz (56.3 kg)  04/02/23 121 lb 3.2 oz (55 kg)  07/09/22 122 lb 12.7 oz (55.7 kg)    Body mass index is 22 kg/m.  Performance status (ECOG): 1 - Symptomatic but completely  ambulatory  PHYSICAL EXAM:   GENERAL:alert, no distress and comfortable SKIN: skin color, texture, turgor are normal, no rashes or significant lesions EYES: normal, Conjunctiva are pink and non-injected, sclera clear OROPHARYNX:no exudate, no erythema and lips, buccal mucosa, and tongue normal  NECK: supple, thyroid  normal size, non-tender, without nodularity LYMPH:  no palpable lymphadenopathy in the cervical, axillary or inguinal LUNGS: clear to auscultation and percussion with normal breathing effort HEART: regular rate & rhythm and no murmurs and no lower extremity edema ABDOMEN:abdomen soft, non-tender and normal bowel sounds Musculoskeletal:no cyanosis of digits and no clubbing  NEURO: alert & oriented x 3 with fluent speech, no focal motor/sensory deficits  LABORATORY DATA:  I have reviewed the data as listed    Component Value Date/Time   NA 132 (L) 03/25/2024 1051   NA 139 04/26/2013 1042   K 4.4 03/25/2024 1051   K 4.5 04/26/2013 1042   CL 99 03/25/2024 1051   CL 104 05/04/2012 1010   CO2 27 03/25/2024 1051   CO2 27 04/26/2013 1042   GLUCOSE 132 (H) 03/25/2024 1051   GLUCOSE 96 04/26/2013 1042   GLUCOSE 98 05/04/2012 1010   BUN 13 03/25/2024 1051   BUN 12.7 04/26/2013  1042   CREATININE 0.63 03/25/2024 1051   CREATININE 0.8 04/26/2013 1042   CALCIUM 9.3 03/25/2024 1051   CALCIUM 9.8 04/26/2013 1042   PROT 6.9 03/25/2024 1051   PROT 7.0 04/26/2013 1042   ALBUMIN 4.3 03/25/2024 1051   ALBUMIN 3.5 04/26/2013 1042   AST 16 03/25/2024 1051   AST 16 04/26/2013 1042   ALT 10 03/25/2024 1051   ALT 12 04/26/2013 1042   ALKPHOS 49 03/25/2024 1051   ALKPHOS 45 04/26/2013 1042   BILITOT 0.3 03/25/2024 1051   BILITOT 0.33 04/26/2013 1042   GFRNONAA >60 03/25/2024 1051    Lab Results  Component Value Date   WBC 13.5 (H) 03/25/2024   NEUTROABS 8.4 (H) 03/25/2024   HGB 11.9 (L) 03/25/2024   HCT 34.9 (L) 03/25/2024   MCV 90.6 03/25/2024   PLT 453 (H) 03/25/2024

## 2024-03-24 NOTE — Assessment & Plan Note (Addendum)
 Leukocytosis, lymphocytosis, and thrombocytosis likely reactive  -She has chronic mild leukocytosis with lymphocytosis and intermittent thrombocytosis since 2013. Jak2 05/04/12 was negative.  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   -Patient has had mild intermittent lymphocytosis for >7 years. No neutropenia. She has mild anemia Hgb 11.6 - 12. No B symptoms. This is felt to be reactive or related to previous splenectomy, unlikely LGL leukemia.  -She was found to have B12 deficiency, intrinsic factor is normal. On monthly injections, tolerating well with good response but switched to oral in 01/2023 due to co-pay -Elaine [name] is clinically doing well.  No B symptoms.  Exam is benign, labs are stable WBC 13.5, Hgb 11.9, PLT 453. -Continue observation -Lab in 6 and 12 months, then follow-up in a year

## 2024-03-25 ENCOUNTER — Inpatient Hospital Stay: Attending: Nurse Practitioner

## 2024-03-25 ENCOUNTER — Inpatient Hospital Stay: Admitting: Nurse Practitioner

## 2024-03-25 VITALS — BP 138/74 | HR 74 | Temp 97.8°F | Resp 17 | Ht 63.0 in | Wt 124.2 lb

## 2024-03-25 DIAGNOSIS — D72829 Elevated white blood cell count, unspecified: Secondary | ICD-10-CM

## 2024-03-25 DIAGNOSIS — D2261 Melanocytic nevi of right upper limb, including shoulder: Secondary | ICD-10-CM | POA: Diagnosis not present

## 2024-03-25 DIAGNOSIS — D649 Anemia, unspecified: Secondary | ICD-10-CM | POA: Diagnosis not present

## 2024-03-25 DIAGNOSIS — L821 Other seborrheic keratosis: Secondary | ICD-10-CM | POA: Diagnosis not present

## 2024-03-25 DIAGNOSIS — D225 Melanocytic nevi of trunk: Secondary | ICD-10-CM | POA: Diagnosis not present

## 2024-03-25 DIAGNOSIS — D519 Vitamin B12 deficiency anemia, unspecified: Secondary | ICD-10-CM

## 2024-03-25 DIAGNOSIS — D692 Other nonthrombocytopenic purpura: Secondary | ICD-10-CM | POA: Diagnosis not present

## 2024-03-25 DIAGNOSIS — D2262 Melanocytic nevi of left upper limb, including shoulder: Secondary | ICD-10-CM | POA: Diagnosis not present

## 2024-03-25 DIAGNOSIS — D75839 Thrombocytosis, unspecified: Secondary | ICD-10-CM | POA: Insufficient documentation

## 2024-03-25 DIAGNOSIS — L814 Other melanin hyperpigmentation: Secondary | ICD-10-CM | POA: Diagnosis not present

## 2024-03-25 DIAGNOSIS — Z85828 Personal history of other malignant neoplasm of skin: Secondary | ICD-10-CM | POA: Diagnosis not present

## 2024-03-25 LAB — CBC WITH DIFFERENTIAL (CANCER CENTER ONLY)
Abs Immature Granulocytes: 0.07 K/uL (ref 0.00–0.07)
Basophils Absolute: 0.1 K/uL (ref 0.0–0.1)
Basophils Relative: 0 %
Eosinophils Absolute: 0.1 K/uL (ref 0.0–0.5)
Eosinophils Relative: 1 %
HCT: 34.9 % — ABNORMAL LOW (ref 36.0–46.0)
Hemoglobin: 11.9 g/dL — ABNORMAL LOW (ref 12.0–15.0)
Immature Granulocytes: 1 %
Lymphocytes Relative: 31 %
Lymphs Abs: 4.2 K/uL — ABNORMAL HIGH (ref 0.7–4.0)
MCH: 30.9 pg (ref 26.0–34.0)
MCHC: 34.1 g/dL (ref 30.0–36.0)
MCV: 90.6 fL (ref 80.0–100.0)
Monocytes Absolute: 0.8 K/uL (ref 0.1–1.0)
Monocytes Relative: 6 %
Neutro Abs: 8.4 K/uL — ABNORMAL HIGH (ref 1.7–7.7)
Neutrophils Relative %: 61 %
Platelet Count: 453 K/uL — ABNORMAL HIGH (ref 150–400)
RBC: 3.85 MIL/uL — ABNORMAL LOW (ref 3.87–5.11)
RDW: 13.8 % (ref 11.5–15.5)
WBC Count: 13.5 K/uL — ABNORMAL HIGH (ref 4.0–10.5)
nRBC: 0 % (ref 0.0–0.2)

## 2024-03-25 LAB — CMP (CANCER CENTER ONLY)
ALT: 10 U/L (ref 0–44)
AST: 16 U/L (ref 15–41)
Albumin: 4.3 g/dL (ref 3.5–5.0)
Alkaline Phosphatase: 49 U/L (ref 38–126)
Anion gap: 6 (ref 5–15)
BUN: 13 mg/dL (ref 8–23)
CO2: 27 mmol/L (ref 22–32)
Calcium: 9.3 mg/dL (ref 8.9–10.3)
Chloride: 99 mmol/L (ref 98–111)
Creatinine: 0.63 mg/dL (ref 0.44–1.00)
GFR, Estimated: >60 mL/min (ref >60)
Glucose, Bld: 132 mg/dL — ABNORMAL HIGH (ref 70–99)
Potassium: 4.4 mmol/L (ref 3.5–5.1)
Sodium: 132 mmol/L — ABNORMAL LOW (ref 135–145)
Total Bilirubin: 0.3 mg/dL (ref 0.0–1.2)
Total Protein: 6.9 g/dL (ref 6.5–8.1)

## 2024-03-25 LAB — VITAMIN B12: Vitamin B-12: 860 pg/mL (ref 180–914)

## 2024-03-26 ENCOUNTER — Telehealth: Payer: Self-pay | Admitting: Nurse Practitioner

## 2024-03-26 NOTE — Telephone Encounter (Signed)
 Left the patient a voicemail with the scheduled appointment details.

## 2024-03-28 ENCOUNTER — Encounter: Payer: Self-pay | Admitting: Nurse Practitioner

## 2024-04-01 ENCOUNTER — Other Ambulatory Visit: Payer: Medicare HMO

## 2024-04-01 ENCOUNTER — Ambulatory Visit: Payer: Medicare HMO | Admitting: Nurse Practitioner

## 2024-04-01 DIAGNOSIS — E039 Hypothyroidism, unspecified: Secondary | ICD-10-CM | POA: Diagnosis not present

## 2024-04-01 DIAGNOSIS — D649 Anemia, unspecified: Secondary | ICD-10-CM | POA: Diagnosis not present

## 2024-04-01 DIAGNOSIS — E7800 Pure hypercholesterolemia, unspecified: Secondary | ICD-10-CM | POA: Diagnosis not present

## 2024-04-01 DIAGNOSIS — E559 Vitamin D deficiency, unspecified: Secondary | ICD-10-CM | POA: Diagnosis not present

## 2024-04-01 DIAGNOSIS — Z0000 Encounter for general adult medical examination without abnormal findings: Secondary | ICD-10-CM | POA: Diagnosis not present

## 2024-04-01 DIAGNOSIS — R7303 Prediabetes: Secondary | ICD-10-CM | POA: Diagnosis not present

## 2024-04-01 DIAGNOSIS — E538 Deficiency of other specified B group vitamins: Secondary | ICD-10-CM | POA: Diagnosis not present

## 2024-04-08 DIAGNOSIS — E559 Vitamin D deficiency, unspecified: Secondary | ICD-10-CM | POA: Diagnosis not present

## 2024-04-08 DIAGNOSIS — E538 Deficiency of other specified B group vitamins: Secondary | ICD-10-CM | POA: Diagnosis not present

## 2024-04-08 DIAGNOSIS — E039 Hypothyroidism, unspecified: Secondary | ICD-10-CM | POA: Diagnosis not present

## 2024-04-08 DIAGNOSIS — Z8589 Personal history of malignant neoplasm of other organs and systems: Secondary | ICD-10-CM | POA: Diagnosis not present

## 2024-04-08 DIAGNOSIS — Z23 Encounter for immunization: Secondary | ICD-10-CM | POA: Diagnosis not present

## 2024-04-08 DIAGNOSIS — R7303 Prediabetes: Secondary | ICD-10-CM | POA: Diagnosis not present

## 2024-04-08 DIAGNOSIS — M810 Age-related osteoporosis without current pathological fracture: Secondary | ICD-10-CM | POA: Diagnosis not present

## 2024-04-08 DIAGNOSIS — Z0000 Encounter for general adult medical examination without abnormal findings: Secondary | ICD-10-CM | POA: Diagnosis not present

## 2024-04-08 DIAGNOSIS — Q8901 Asplenia (congenital): Secondary | ICD-10-CM | POA: Diagnosis not present

## 2024-04-21 DIAGNOSIS — M1712 Unilateral primary osteoarthritis, left knee: Secondary | ICD-10-CM | POA: Diagnosis not present

## 2024-04-29 DIAGNOSIS — M1712 Unilateral primary osteoarthritis, left knee: Secondary | ICD-10-CM | POA: Diagnosis not present

## 2024-05-07 DIAGNOSIS — M1712 Unilateral primary osteoarthritis, left knee: Secondary | ICD-10-CM | POA: Diagnosis not present

## 2024-09-30 ENCOUNTER — Other Ambulatory Visit

## 2025-03-31 ENCOUNTER — Other Ambulatory Visit

## 2025-03-31 ENCOUNTER — Ambulatory Visit: Admitting: Nurse Practitioner
